# Patient Record
Sex: Male | Born: 1940 | Race: White | Hispanic: No | Marital: Married | Smoking: Never smoker
Health system: Southern US, Community
[De-identification: ages and names within clinical notes are randomized; demographics above are authoritative.]

## PROBLEM LIST (undated history)

## (undated) DIAGNOSIS — Z8669 Personal history of other diseases of the nervous system and sense organs: Secondary | ICD-10-CM

## (undated) DIAGNOSIS — E785 Hyperlipidemia, unspecified: Secondary | ICD-10-CM

## (undated) DIAGNOSIS — F419 Anxiety disorder, unspecified: Secondary | ICD-10-CM

## (undated) DIAGNOSIS — R351 Nocturia: Secondary | ICD-10-CM

## (undated) DIAGNOSIS — J309 Allergic rhinitis, unspecified: Secondary | ICD-10-CM

## (undated) DIAGNOSIS — I10 Essential (primary) hypertension: Secondary | ICD-10-CM

## (undated) DIAGNOSIS — IMO0002 Reserved for concepts with insufficient information to code with codable children: Secondary | ICD-10-CM

## (undated) DIAGNOSIS — M1990 Unspecified osteoarthritis, unspecified site: Secondary | ICD-10-CM

## (undated) DIAGNOSIS — Z8719 Personal history of other diseases of the digestive system: Secondary | ICD-10-CM

## (undated) DIAGNOSIS — I471 Supraventricular tachycardia, unspecified: Secondary | ICD-10-CM

## (undated) DIAGNOSIS — Z8673 Personal history of transient ischemic attack (TIA), and cerebral infarction without residual deficits: Secondary | ICD-10-CM

## (undated) DIAGNOSIS — N281 Cyst of kidney, acquired: Secondary | ICD-10-CM

## (undated) DIAGNOSIS — Z86010 Personal history of colonic polyps: Secondary | ICD-10-CM

## (undated) DIAGNOSIS — R252 Cramp and spasm: Secondary | ICD-10-CM

## (undated) DIAGNOSIS — C61 Malignant neoplasm of prostate: Secondary | ICD-10-CM

## (undated) DIAGNOSIS — I2510 Atherosclerotic heart disease of native coronary artery without angina pectoris: Secondary | ICD-10-CM

## (undated) DIAGNOSIS — R7303 Prediabetes: Secondary | ICD-10-CM

## (undated) DIAGNOSIS — K5730 Diverticulosis of large intestine without perforation or abscess without bleeding: Secondary | ICD-10-CM

## (undated) DIAGNOSIS — K219 Gastro-esophageal reflux disease without esophagitis: Secondary | ICD-10-CM

## (undated) DIAGNOSIS — I6930 Unspecified sequelae of cerebral infarction: Secondary | ICD-10-CM

## (undated) DIAGNOSIS — Z973 Presence of spectacles and contact lenses: Secondary | ICD-10-CM

## (undated) DIAGNOSIS — Z9289 Personal history of other medical treatment: Secondary | ICD-10-CM

## (undated) DIAGNOSIS — Z860101 Personal history of adenomatous and serrated colon polyps: Secondary | ICD-10-CM

## (undated) DIAGNOSIS — J449 Chronic obstructive pulmonary disease, unspecified: Secondary | ICD-10-CM

## (undated) HISTORY — DX: Essential (primary) hypertension: I10

## (undated) HISTORY — DX: Supraventricular tachycardia, unspecified: I47.10

## (undated) HISTORY — PX: CARDIOVASCULAR STRESS TEST: SHX262

## (undated) HISTORY — PX: TRANSTHORACIC ECHOCARDIOGRAM: SHX275

## (undated) HISTORY — PX: NASAL SINUS SURGERY: SHX719

## (undated) HISTORY — PX: COLONOSCOPY: SHX174

## (undated) HISTORY — DX: Supraventricular tachycardia: I47.1

## (undated) HISTORY — DX: Cyst of kidney, acquired: N28.1

## (undated) HISTORY — DX: Reserved for concepts with insufficient information to code with codable children: IMO0002

## (undated) HISTORY — DX: Gastro-esophageal reflux disease without esophagitis: K21.9

## (undated) HISTORY — DX: Chronic obstructive pulmonary disease, unspecified: J44.9

## (undated) HISTORY — DX: Anxiety disorder, unspecified: F41.9

## (undated) HISTORY — DX: Atherosclerotic heart disease of native coronary artery without angina pectoris: I25.10

## (undated) HISTORY — DX: Unspecified osteoarthritis, unspecified site: M19.90

## (undated) HISTORY — DX: Hyperlipidemia, unspecified: E78.5

---

## 1999-04-11 ENCOUNTER — Encounter: Admission: RE | Admit: 1999-04-11 | Discharge: 1999-04-26 | Payer: Self-pay | Admitting: Family Medicine

## 2001-05-21 ENCOUNTER — Ambulatory Visit (HOSPITAL_COMMUNITY): Admission: RE | Admit: 2001-05-21 | Discharge: 2001-05-21 | Payer: Self-pay | Admitting: *Deleted

## 2001-05-21 ENCOUNTER — Encounter: Payer: Self-pay | Admitting: *Deleted

## 2001-08-17 ENCOUNTER — Emergency Department (HOSPITAL_COMMUNITY): Admission: EM | Admit: 2001-08-17 | Discharge: 2001-08-17 | Payer: Self-pay | Admitting: Emergency Medicine

## 2001-08-31 ENCOUNTER — Ambulatory Visit (HOSPITAL_COMMUNITY): Admission: RE | Admit: 2001-08-31 | Discharge: 2001-08-31 | Payer: Self-pay | Admitting: Family Medicine

## 2001-08-31 ENCOUNTER — Encounter: Payer: Self-pay | Admitting: Family Medicine

## 2001-10-27 ENCOUNTER — Encounter: Payer: Self-pay | Admitting: Emergency Medicine

## 2001-10-27 ENCOUNTER — Emergency Department (HOSPITAL_COMMUNITY): Admission: EM | Admit: 2001-10-27 | Discharge: 2001-10-27 | Payer: Self-pay | Admitting: Emergency Medicine

## 2002-01-24 ENCOUNTER — Ambulatory Visit (HOSPITAL_COMMUNITY): Admission: RE | Admit: 2002-01-24 | Discharge: 2002-01-24 | Payer: Self-pay | Admitting: *Deleted

## 2002-01-24 ENCOUNTER — Encounter: Payer: Self-pay | Admitting: *Deleted

## 2002-02-07 ENCOUNTER — Encounter: Payer: Self-pay | Admitting: Family Medicine

## 2002-02-07 ENCOUNTER — Ambulatory Visit (HOSPITAL_COMMUNITY): Admission: RE | Admit: 2002-02-07 | Discharge: 2002-02-07 | Payer: Self-pay | Admitting: Family Medicine

## 2002-02-23 ENCOUNTER — Ambulatory Visit (HOSPITAL_COMMUNITY): Admission: RE | Admit: 2002-02-23 | Discharge: 2002-02-23 | Payer: Self-pay | Admitting: Family Medicine

## 2002-02-23 ENCOUNTER — Encounter: Payer: Self-pay | Admitting: Family Medicine

## 2002-04-01 ENCOUNTER — Encounter: Payer: Self-pay | Admitting: Internal Medicine

## 2002-04-01 ENCOUNTER — Ambulatory Visit (HOSPITAL_COMMUNITY): Admission: RE | Admit: 2002-04-01 | Discharge: 2002-04-01 | Payer: Self-pay | Admitting: Internal Medicine

## 2002-07-19 ENCOUNTER — Encounter: Payer: Self-pay | Admitting: Family Medicine

## 2002-07-19 ENCOUNTER — Ambulatory Visit (HOSPITAL_COMMUNITY): Admission: RE | Admit: 2002-07-19 | Discharge: 2002-07-19 | Payer: Self-pay | Admitting: Family Medicine

## 2004-11-16 HISTORY — PX: ESOPHAGOGASTRODUODENOSCOPY (EGD) WITH ESOPHAGEAL DILATION: SHX5812

## 2004-11-18 ENCOUNTER — Ambulatory Visit (HOSPITAL_COMMUNITY): Admission: RE | Admit: 2004-11-18 | Discharge: 2004-11-18 | Payer: Self-pay | Admitting: Family Medicine

## 2004-11-22 ENCOUNTER — Ambulatory Visit (HOSPITAL_COMMUNITY): Admission: RE | Admit: 2004-11-22 | Discharge: 2004-11-22 | Payer: Self-pay | Admitting: Family Medicine

## 2004-11-27 ENCOUNTER — Ambulatory Visit: Payer: Self-pay | Admitting: Internal Medicine

## 2004-12-10 ENCOUNTER — Ambulatory Visit: Payer: Self-pay | Admitting: Internal Medicine

## 2005-05-15 ENCOUNTER — Ambulatory Visit (HOSPITAL_COMMUNITY): Admission: RE | Admit: 2005-05-15 | Discharge: 2005-05-15 | Payer: Self-pay | Admitting: Family Medicine

## 2005-06-17 ENCOUNTER — Encounter (INDEPENDENT_AMBULATORY_CARE_PROVIDER_SITE_OTHER): Payer: Self-pay | Admitting: *Deleted

## 2005-06-17 ENCOUNTER — Ambulatory Visit (HOSPITAL_COMMUNITY): Admission: RE | Admit: 2005-06-17 | Discharge: 2005-06-17 | Payer: Self-pay | Admitting: Gastroenterology

## 2005-06-18 ENCOUNTER — Ambulatory Visit (HOSPITAL_COMMUNITY): Admission: RE | Admit: 2005-06-18 | Discharge: 2005-06-18 | Payer: Self-pay | Admitting: Family Medicine

## 2008-09-04 ENCOUNTER — Encounter: Admission: RE | Admit: 2008-09-04 | Discharge: 2008-09-04 | Payer: Self-pay | Admitting: Internal Medicine

## 2009-05-25 ENCOUNTER — Ambulatory Visit (HOSPITAL_COMMUNITY): Admission: RE | Admit: 2009-05-25 | Discharge: 2009-05-25 | Payer: Self-pay | Admitting: Family Medicine

## 2009-05-25 DIAGNOSIS — I6930 Unspecified sequelae of cerebral infarction: Secondary | ICD-10-CM

## 2009-05-25 HISTORY — DX: Unspecified sequelae of cerebral infarction: I69.30

## 2009-06-27 ENCOUNTER — Encounter: Admission: RE | Admit: 2009-06-27 | Discharge: 2009-06-27 | Payer: Self-pay | Admitting: Neurology

## 2009-07-23 ENCOUNTER — Ambulatory Visit: Payer: Self-pay

## 2009-07-23 ENCOUNTER — Encounter (INDEPENDENT_AMBULATORY_CARE_PROVIDER_SITE_OTHER): Payer: Self-pay | Admitting: Neurology

## 2009-07-23 ENCOUNTER — Ambulatory Visit (HOSPITAL_COMMUNITY): Admission: RE | Admit: 2009-07-23 | Discharge: 2009-07-23 | Payer: Self-pay | Admitting: Neurology

## 2009-07-23 ENCOUNTER — Ambulatory Visit: Payer: Self-pay | Admitting: Cardiology

## 2009-07-24 ENCOUNTER — Encounter: Payer: Self-pay | Admitting: Cardiology

## 2009-08-08 ENCOUNTER — Telehealth (INDEPENDENT_AMBULATORY_CARE_PROVIDER_SITE_OTHER): Payer: Self-pay | Admitting: *Deleted

## 2010-08-18 HISTORY — PX: APPENDECTOMY: SHX54

## 2010-08-18 HISTORY — PX: INGUINAL HERNIA REPAIR: SUR1180

## 2010-09-06 ENCOUNTER — Ambulatory Visit (HOSPITAL_COMMUNITY)
Admission: RE | Admit: 2010-09-06 | Discharge: 2010-09-06 | Payer: Self-pay | Source: Home / Self Care | Attending: Family Medicine | Admitting: Family Medicine

## 2010-09-07 ENCOUNTER — Encounter: Payer: Self-pay | Admitting: Family Medicine

## 2010-09-08 ENCOUNTER — Encounter: Payer: Self-pay | Admitting: Family Medicine

## 2010-09-09 LAB — POCT I-STAT, CHEM 8
BUN: 19 mg/dL (ref 6–23)
Calcium, Ion: 1.19 mmol/L (ref 1.12–1.32)
Chloride: 103 mEq/L (ref 96–112)
Creatinine, Ser: 1.3 mg/dL (ref 0.4–1.5)
Glucose, Bld: 96 mg/dL (ref 70–99)
HCT: 50 % (ref 39.0–52.0)
Hemoglobin: 17 g/dL (ref 13.0–17.0)
Potassium: 4.7 mEq/L (ref 3.5–5.1)
Sodium: 140 meq/L (ref 135–145)
TCO2: 29 mmol/L (ref 0–100)

## 2010-10-20 ENCOUNTER — Emergency Department (HOSPITAL_COMMUNITY): Payer: Medicare Other

## 2010-10-20 ENCOUNTER — Inpatient Hospital Stay (HOSPITAL_COMMUNITY)
Admission: EM | Admit: 2010-10-20 | Discharge: 2010-10-22 | DRG: 313 | Disposition: A | Discharge status: Home / Self Care | Payer: Medicare Other | Attending: Internal Medicine | Admitting: Internal Medicine

## 2010-10-20 DIAGNOSIS — J449 Chronic obstructive pulmonary disease, unspecified: Secondary | ICD-10-CM | POA: Diagnosis present

## 2010-10-20 DIAGNOSIS — K219 Gastro-esophageal reflux disease without esophagitis: Secondary | ICD-10-CM | POA: Diagnosis present

## 2010-10-20 DIAGNOSIS — R079 Chest pain, unspecified: Principal | ICD-10-CM | POA: Diagnosis present

## 2010-10-20 DIAGNOSIS — I10 Essential (primary) hypertension: Secondary | ICD-10-CM | POA: Diagnosis present

## 2010-10-20 DIAGNOSIS — G4733 Obstructive sleep apnea (adult) (pediatric): Secondary | ICD-10-CM | POA: Diagnosis present

## 2010-10-20 DIAGNOSIS — E785 Hyperlipidemia, unspecified: Secondary | ICD-10-CM | POA: Diagnosis present

## 2010-10-20 DIAGNOSIS — I209 Angina pectoris, unspecified: Secondary | ICD-10-CM | POA: Diagnosis present

## 2010-10-20 DIAGNOSIS — K224 Dyskinesia of esophagus: Secondary | ICD-10-CM | POA: Diagnosis present

## 2010-10-20 DIAGNOSIS — E781 Pure hyperglyceridemia: Secondary | ICD-10-CM | POA: Diagnosis present

## 2010-10-20 DIAGNOSIS — J4489 Other specified chronic obstructive pulmonary disease: Secondary | ICD-10-CM | POA: Diagnosis present

## 2010-10-20 LAB — CBC
HCT: 45 % (ref 39.0–52.0)
Hemoglobin: 15.3 g/dL (ref 13.0–17.0)
MCH: 29.9 pg (ref 26.0–34.0)
MCHC: 34 g/dL (ref 30.0–36.0)
MCV: 87.9 fL (ref 78.0–100.0)
Platelets: 136 10*3/uL — ABNORMAL LOW (ref 150–400)
RDW: 12.7 % (ref 11.5–15.5)
WBC: 5.9 10*3/uL (ref 4.0–10.5)

## 2010-10-20 LAB — POCT I-STAT, CHEM 8
BUN: 16 mg/dL (ref 6–23)
Creatinine, Ser: 0.9 mg/dL (ref 0.4–1.5)
Glucose, Bld: 109 mg/dL — ABNORMAL HIGH (ref 70–99)
Hemoglobin: 15.6 g/dL (ref 13.0–17.0)
Potassium: 4 mEq/L (ref 3.5–5.1)

## 2010-10-20 LAB — POCT CARDIAC MARKERS: Troponin i, poc: <0.05 ng/mL (ref 0.00–0.09)

## 2010-10-20 LAB — CARDIAC PANEL(CRET KIN+CKTOT+MB+TROPI): Total CK: 250 U/L — ABNORMAL HIGH (ref 7–232)

## 2010-10-21 LAB — CARDIAC PANEL(CRET KIN+CKTOT+MB+TROPI)
CK, MB: 4.9 ng/mL — ABNORMAL HIGH (ref 0.3–4.0)
CK, MB: 4.7 ng/mL — ABNORMAL HIGH (ref 0.3–4.0)
Relative Index: 2.6 — ABNORMAL HIGH (ref 0.0–2.5)
Total CK: 191 U/L (ref 7–232)
Total CK: 172 U/L (ref 7–232)
Troponin I: 0.01 ng/mL (ref 0.00–0.06)
Troponin I: 0.01 ng/mL (ref 0.00–0.06)

## 2010-10-21 LAB — COMPREHENSIVE METABOLIC PANEL
AST: 27 U/L (ref 0–37)
BUN: 13 mg/dL (ref 6–23)
Calcium: 8.6 mg/dL (ref 8.4–10.5)
Chloride: 106 mEq/L (ref 96–112)
Creatinine, Ser: 0.87 mg/dL (ref 0.4–1.5)
GFR calc Af Amer: >60 mL/min (ref >60)
GFR calc non Af Amer: >60 mL/min (ref >60)
Glucose, Bld: 113 mg/dL — ABNORMAL HIGH (ref 70–99)
Sodium: 139 mEq/L (ref 135–145)
Total Bilirubin: 0.7 mg/dL (ref 0.3–1.2)

## 2010-10-21 LAB — CBC
MCH: 29.6 pg (ref 26.0–34.0)
MCV: 88.9 fL (ref 78.0–100.0)
RBC: 5.13 MIL/uL (ref 4.22–5.81)
WBC: 5.2 10*3/uL (ref 4.0–10.5)

## 2010-10-21 LAB — LIPID PANEL
Total CHOL/HDL Ratio: 4.9 RATIO
VLDL: 58 mg/dL — ABNORMAL HIGH (ref 0–40)

## 2010-10-21 LAB — HEMOGLOBIN A1C: Mean Plasma Glucose: 123 mg/dL — ABNORMAL HIGH (ref <117)

## 2010-10-21 LAB — MAGNESIUM: Magnesium: 2.4 mg/dL (ref 1.5–2.5)

## 2010-10-24 NOTE — H&P (Signed)
NAME:  GUHAN, BRUINGTON NO.:  [id number]  MEDICAL RECORD NO.:  [id number]           PATIENT TYPE:  E  LOCATION:  WLED                         FACILITY:  [facility]  PHYSICIAN:  Derik Fults I Quentin Shorey, MD      DATE OF BIRTH:  05/31/1941  DATE OF ADMISSION:  10/20/2010 DATE OF DISCHARGE:                             HISTORY & PHYSICAL   PRIMARY CARE PHYSICIAN:  Kerri Perches, MD, from [place].  CHIEF COMPLAINT:  Chest pain.  HISTORY OF PRESENT ILLNESS:  This is a 70 year old Caucasian, very present gentleman with history of hypercholesterolemia, hypertension, asthma/COPD, who was in his regular state of health until today while in the church.  They were standing and he felt sudden onset of left side chest pain, started abnormal feeling on his neck and throat, radiated into his chest or vice versa, it started on his chest and radiated to his neck and jaw.  Pain was severe, 8/10 and not associated with sweating or palpitation.  Pain kept on and off.  There is no relieving factor, but on his way to the hospital, he is admitted, the pain is resolved.  While I am doing some conversation with the patient, he developed another few seconds chest pain, no palpitation, no shortness of breath.  The patient admitted he has similar episode of this pain last week, happened twice, and they planned to follow up with his primary care regarding this pain.  As mentioned, the patient denies any cough.  He had episode of pleuritic chest pain when laughing, happened few times, and per his MD this is secondary to pleurisy.  The patient denies any numbness or weakness of his extremity.  Denies any blurring of vision.  Denies any neck pain.  The patient admitted, pain currently resolved.  Denies any cough.  Denies any fever.  Denies any palpitation. Denies any shortness of breath.  Denies any orthopnea or paroxysmal nocturnal dyspnea.  Denies any nausea, vomiting, or abdominal pain. Last bowel movement  was yesterday.  Denies any back pain.  Denies any abnormal rashes.  PAST MEDICAL HISTORY:  Significant for; 1. Hypertension. 2. Hypercholesterolemia. 3. Gastroesophageal reflux disease. 4. History of benign prostatic hypertrophy. 5. History of GI bleeding, status post colonoscopy, which is small     ascending colon polyp. 6. History of stroke, the patient on Plavix for that.  ALLERGIES: 1. KEFLEX. 2. SULFA.  PAST SURGICAL HISTORY:  History of sinus surgery and history of hernia repair.  FAMILY HISTORY:  Mother died with hemorrhagic stroke, father died with complication of Parkinson.  He has a sister, who died from breast cancer with metastasis and a brother who died with automobile accident.  MEDICATIONS: 1. __________. 2. Guaifenesin. 3. Atenolol 50 mg. 4. Etodolac 400. 5. Simvastatin 20 mg. 6. Advair Diskus 150. 7. Protonix 40 mg. 8. Plavix 75 mg. 9. Niacin. 10.Multivitamin. 11.Aw palmetto 12.Fish oil.  SYSTEMIC REVIEW:  As per HPI.  PHYSICAL EXAMINATION:  VITAL SIGNS:  On admission, temperature 98, bloodpressure 158/83, pulse rate 66, respiratory rate 18, saturating 96% on room air. HEENT:  Normocephalic, atraumatic.  Pupil equal, reactive  to light and accommodation.  An  extraocular muscle movement within normal.  Nares patent.  No discharge.  Mucosa moist.  No tonsillar hypertrophy.  No thrush. NECK:  Supple.  No lymphadenopathy. HEART:  S1 and S2 with no added sound, mildly distant. LUNGS:  Normal vesicular breathing with equal air entry.  No rales, no crackles. ABDOMEN:  Soft, nontender.  Bowel sounds positive.  No organomegaly.  No rebound or guarding. EXTREMITIES:  No lower edema.  Peripheral pulses intact bilaterally. BACK:  No point tenderness.  LABORATORY DATA:  On exam, blood work did show cardiac marker x1 negative.  Hemoglobin 15.6, hematocrit 46.  Sodium 140, potassium 4, chloride 108, glucose 109, BUN 16, creatinine 0.9.  Chest x-ray,  no active disease.  EKG did show normal sinus rhythm, left axis deviation, minimum voltage criteria for LVH, may be normal variant, abnormal EKG.  ASSESSMENT AND PLAN:  This is a 70 year old male with a history of hypertension, hypercholesterolemia, history of stroke in the past. 1. He complains of recurrent chest pain this week.  We will admit the     patient for evaluation and cardiac risk of stratification.  The     patient will be admitted to Telemetry.  We will cycle cardiac     enzyme, will get D-dimer, will get 2D-echo.  Repeat EKG.  We will     provide the patient with nitroglycerin sublingual.  The patient     used atenolol.  We will continue with atenolol p.o.  The patient     did see Dr. __________ in the past for evaluation of abnormal     rhythm.  We will consider consulting them in a.m. if pain persists. 2. Hypertension.  We will continue with home medication. 3. History of chronic obstructive pulmonary disease/asthma.  We will     continue with nebs treatment.  Further recommendation to be     addressed as hospital course progresses.     Salma Walrond Bosie Helper, MD     HIE/MEDQ  D:  10/20/2010  T:  10/20/2010  Job:  [id number]  cc:   Kerri Perches, MD  Electronically Signed by Ebony Cargo MD on 10/23/2010 05:13:37 PM

## 2010-10-25 NOTE — Consult Note (Signed)
NAME:  Gregory Pham, Gregory Pham NO.:  [id number]  MEDICAL RECORD NO.:  [id number]           PATIENT TYPE:  LOCATION:                                 FACILITY:  PHYSICIAN:  Armanda Magic, M.D.     DATE OF BIRTH:  1941/07/26  DATE OF CONSULTATION:  10/21/2010 DATE OF DISCHARGE:                                CONSULTATION   REFERRING PHYSICIAN:  [facility].  PRIMARY PHYSICIAN:  Fayrene Fearing, physician assistant.  CHIEF COMPLAINT:  Chest pain.  HISTORY OF PRESENT ILLNESS:  This is a very pleasant 70 year old male with a history of dyslipidemia, hypertension, and asthma/COPD who was in his usual state of health until yesterday when he was in church.  He said he stood up and felt sudden onset of left-sided chest pain and started feeling a funny sensation in his neck and throat, which radiated up into his chest.  It was severe at a 10 but not associated with sweating or palpitations.  The pain was intermittent and he decided to leave church.  He denied any diaphoresis but said he was belching a lot when he got to the emergency room.  On arrival in the ER, he was painfree.  Apparently, he had a similar episode of pain a week ago but he said it was more pleuritic and hurt when he laughed.  We are now asked to consult for further evaluation.  PAST MEDICAL HISTORY:  Hypertension, dyslipidemia, gastroesophageal reflux disease, history of BPH, history of GI bleed, status post colonoscopy with small ascending colon polyps, history of CVA currently on Plavix.  ALLERGIES:  Multiple include KEFLEX and SULFA.  PAST SURGICAL HISTORY:  History of sinus surgery, hernia repair, and appendectomy.  FAMILY HISTORY:  His mother died of a hemorrhagic CVA.  His father died with complications of Parkinson.  He has a sister who died from breast CA with mets and a brother who died in an automobile accident.  MEDICATION:  Guaifenesin, atenolol 50 mg daily, etodolac 400  mg, simvastatin 20 mg daily, Advair Diskus 150, Protonix 40 mg daily, Plavix 75 mg daily, niacin, multivitamin, saw palmetto, and fish oil.  REVIEW OF SYSTEMS:  Other than what is stated in HPI is negative.  PHYSICAL EXAMINATION:  VITAL SIGNS:  His blood pressure is 118/64, heart rate 92, respirations 20s, he is afebrile, O2 saturation 98% on room air. GENERAL:  He is a well-developed, well-nourished white male in no acute distress. HEENT:  Benign. NECK:  Supple without lymphadenopathy.  Carotid upstrokes are +2 bilaterally.  No bruits. LUNGS:  Clear to auscultation throughout. HEART:  Regular rate and rhythm.  No murmurs, rubs, or gallops.  Normal S1 and S2. ABDOMEN:  Soft, nontender, and nondistended.  Normoactive bowel sounds. No hepatosplenomegaly. EXTREMITIES:  No edema.  LABORATORY DATA:  Sodium 140, potassium 4, chloride 108, CO2 22, BUN 16, creatinine 0.9, glucose 109.  Point of care markers negative x1, D-dimer 0.39.  Cardiac enzymes, CPK 250, 191, 172 with MB 6.6, 4.9, and 4.7. Troponins were 0.01, 0.01, and 0.01.  BNP less than 30.  White blood cell count  5.2, hemoglobin 15.2, hematocrit 45.6, platelet count 130. Magnesium 2.4, phosphorus 3.5, total cholesterol 153, triglycerides 288, HDL 31, LDL 64, hemoglobin A1c 5.9%.  Chest x-ray showed no active disease.  EKG shows sinus rhythm at 74 beats per minute with left ventricular hypertrophy by voltage criteria with no ST-T wave abnormalities.  ASSESSMENT: 1. Chest pain with positive CPK-MB but normal CPK, troponin.  The MB     is barely elevated.  EKG is nonischemic. 2. Hypertension. 3. Chronic obstructive sleep apnea and asthma. 4. Dyslipidemia.  PLAN:  The patient has not had any further chest pain after being admitted.  His pain could be due to either underlying CAD, but also could be due to gastroesophageal reflux with esophageal spasm. Especially given the fact that it was associated with belching which seemed  after belching to resolve his pain.  Given his nonischemic EKG and normal troponin levels, we will proceed with nuclear stress test.  I will make him n.p.o. after midnight and plan nuclear stress Cardiolite study in the morning on October 22, 2010.     Armanda Magic, M.D.     TT/MEDQ  D:  10/21/2010  T:  10/22/2010  Job:  [id number]  cc:   Fayrene Fearing  Electronically Signed by Armanda Magic M.D. on 10/24/2010 04:20:05 PM

## 2010-11-23 NOTE — Discharge Summary (Signed)
  NAME:  Gregory Pham, Gregory Pham NO.:  [id number]  MEDICAL RECORD NO.:  [id number]           PATIENT TYPE:  I  LOCATION:  1419                         FACILITY:  [facility]  PHYSICIAN:  Richarda Overlie, MD       DATE OF BIRTH:  Oct 08, 1940  DATE OF ADMISSION:  10/20/2010 DATE OF DISCHARGE:  10/22/2010                        DISCHARGE SUMMARY - REFERRING   PRIMARY CARE PHYSICIAN:  Dr. Gaetano Hawthorne.  Dictation ended at this point.     Richarda Overlie, MD     NA/MEDQ  D:  10/22/2010  T:  10/22/2010  Job:  [id number]  Electronically Signed by Richarda Overlie MD on 11/23/2010 08:34:12 PM

## 2010-11-23 NOTE — Discharge Summary (Signed)
NAME:  Gregory Pham, Gregory Pham NO.:  [id number]  MEDICAL RECORD NO.:  [id number]           PATIENT TYPE:  I  LOCATION:  1419                         FACILITY:  [facility]  PHYSICIAN:  Richarda Overlie, MD       DATE OF BIRTH:  03/02/1941  DATE OF ADMISSION:  10/20/2010 DATE OF DISCHARGE:  10/22/2010                        DISCHARGE SUMMARY - REFERRING   PRIMARY CARE PHYSICIAN:  Dr. Burney Gauze  DISCHARGE DIAGNOSIS: 1. Chest pain.  Stress test pending at this time. 2. Hypertension. 3. Chronic obstructive pulmonary disease. 4. Sleep apnea. 5. Asthma. 6. Dyslipidemia. 7. Hypertriglyceridemia.  CONSULTATION:  Dr. Carolanne Grumbling for chest pain.  PROCEDURES:  Chest x-ray shows no active disease.  SUBJECTIVE:  This 70 year old Caucasian male presents with a history of dyslipidemia, hypertension, asthma, COPD, who was in his regular state of health until today when he had sudden onset of dizziness and left- sided chest pain and started having abnormal feeling on his neck and throat radiated into the chest and vice versa.  The pain started in his chest and radiated into the neck and jaw, described 8/10 on  intensity without any relieving or aggravating factors.  The patient subsequently admitted for further evaluation.  HOSPITAL COURSE: 1. Chest pain.  The patient's cardiac enzymes were cycled and troponin     was negative but a CK-MB was mildly elevated with an increased     relative index.  The patient was seen by Cardiology, Dr. Carolanne Grumbling who wanted to do an inpatient stress test, however, the     inpatient stress test was scheduled for later in the day, however,     the patient became anxious and wanted to go home and get an     outpatient stress test.  An outpatient stress test was arranged for     by Dr. Carolanne Grumbling and Dr. Donato Schultz on October 23, 2010, at around     10:00 a.m. in their office.  Cardiology also stated that the     patient's chest discomfort could  be typical angina or it could also     be gastroesophageal reflux disease with esophageal spasm.  The     patient's EKG appears to be nonischemic and his troponin were     within normal limits. 2. Hypertension.  The patient was found to be normotensive during this     hospitalization, however, he was advised to hold his atenolol prior     to the stress test.  DISCHARGE MEDICATIONS: 1. Nitroglycerin sublingual 0.4 q. 5 minutes x3. 2. Melatonin 1 tablet p.o. q.h.s. 3. Atenolol 50 mg p.o. q.a.m.  The patient advised to hold this. 4. Etodolac has been discontinued. 5. Simvastatin 20 mg p.o. q.a.m. 6. Advair Diskus 100/50 one puff inhaled twice a day. 7. Flonase 2 puffs q.p.m. 8. Protonix 40 mg p.o. q.p.m. 9. Plavix 75 mg p.o. q.a.m. 10.Niacin 500 mg p.o. t.i.d. 11.Multivitamin 1 tablet p.o. daily. 12.Cranberry 2 tablets p.o. 3 times a day. 13.Saw palmetto 2 tablets p.o. 3 times a day. 14.Ester-C 1000 mg p.o. q.a.m. 15.Fish oil 1000 one  to two tablets p.o. t.i.d. 16.Glucosamine 3 tablets p.o. daily. 17.Hydroxyzine 0.5 mg p.o. q.h.s.18.Guaifenesin 400 mg 2 tablets p.o. twice a day.     Richarda Overlie, MD     NA/MEDQ  D:  10/22/2010  T:  10/22/2010  Job:  [id number]  cc:   Dr. Burney Gauze  Electronically Signed by Richarda Overlie MD on 11/23/2010 08:34:33 PM

## 2011-01-03 NOTE — Op Note (Signed)
NAME:  Gregory Pham, Gregory Pham NO.:  [id number]   MEDICAL RECORD NO.:  [id number]          PATIENT TYPE:  AMB   LOCATION:  ENDO                         FACILITY:  [facility]   PHYSICIAN:  Graylin Shiver, M.D.   DATE OF BIRTH:  13-Mar-1941   DATE OF PROCEDURE:  06/17/2005  DATE OF DISCHARGE:                                 OPERATIVE REPORT   INDICATIONS FOR PROCEDURE:  Heme-positive stool, family history of colon  cancer in the patient's sister.   Informed consent was obtained after explanation of the risks of bleeding,  infection and perforation.   PREMEDICATION:  Fentanyl 50 mcg IV, Versed 5 milligrams IV.   PROCEDURE:  With the patient in the left lateral decubitus position a rectal  exam was performed. No masses were felt. The Olympus colonoscope was  inserted into the rectum and advanced around the colon to the cecum. Cecal  landmarks were identified. The cecum was normal.  In the mid descending  colon, there was a 2-mm polyp biopsied off with cold forceps. The transverse  colon looked normal. The descending colon, sigmoid and rectum looked normal.  The scope was retroflexed in the rectum. No abnormalities were seen. He  tolerated the procedure well without complications.   IMPRESSION:  Small ascending colon polyp biopsied off.   PLAN:  The pathology will be checked.           ______________________________  Graylin Shiver, M.D.     SFG/MEDQ  D:  06/17/2005  T:  06/17/2005  Job:  [id number]   cc:   Holley Bouche, M.D.  Fax: [phone]

## 2011-08-19 DIAGNOSIS — Z8673 Personal history of transient ischemic attack (TIA), and cerebral infarction without residual deficits: Secondary | ICD-10-CM

## 2011-08-19 HISTORY — DX: Personal history of transient ischemic attack (TIA), and cerebral infarction without residual deficits: Z86.73

## 2011-10-21 ENCOUNTER — Encounter: Payer: Self-pay | Admitting: Internal Medicine

## 2011-11-11 ENCOUNTER — Ambulatory Visit (INDEPENDENT_AMBULATORY_CARE_PROVIDER_SITE_OTHER): Payer: Medicare Other | Admitting: Internal Medicine

## 2011-11-11 ENCOUNTER — Encounter: Payer: Self-pay | Admitting: Internal Medicine

## 2011-11-11 VITALS — BP 124/68 | HR 60 | Ht 71.0 in | Wt 181.0 lb

## 2011-11-11 DIAGNOSIS — Z86010 Personal history of colonic polyps: Secondary | ICD-10-CM

## 2011-11-11 DIAGNOSIS — Z7902 Long term (current) use of antithrombotics/antiplatelets: Secondary | ICD-10-CM

## 2011-11-11 DIAGNOSIS — Z01818 Encounter for other preprocedural examination: Secondary | ICD-10-CM

## 2011-11-11 DIAGNOSIS — I679 Cerebrovascular disease, unspecified: Secondary | ICD-10-CM

## 2011-11-11 DIAGNOSIS — Z1211 Encounter for screening for malignant neoplasm of colon: Secondary | ICD-10-CM

## 2011-11-11 MED ORDER — PEG-KCL-NACL-NASULF-NA ASC-C 100 G PO SOLR: 1.0000 | Freq: Once | ORAL | Status: DC

## 2011-11-11 NOTE — Patient Instructions (Addendum)
You have been scheduled for a colonoscopy. Please follow written instructions given to you at your visit today.  Please pick up your prep kit at the pharmacy within the next 1-3 days. You will be contacted by our office prior to your procedure for directions on holding your Plavix.  If you do not hear from our office 1 week prior to your scheduled procedure, please call [phone] to discuss.

## 2011-11-11 NOTE — Progress Notes (Signed)
Subjective:    Patient ID: Gregory Pham, male    DOB: [birth year], 71 y.o.   MRN: [id number]  HPI This is a very pleasant elderly white man that I know from prior colonoscopy (2003) and upper endoscopy + esophageal dilation (2006)procedures. He returns at this time to discuss and schedule a colonoscopy for colon cancer screening and polyp followup. I had performed a colonoscopy in 2003 with diverticulosis and hemorrhoids found. In 2006, Dr. Evette Cristal found a diminutive adenomatous polyp in the descending colon. The patient requested to return here for routine repeat colonoscopy.  He thinks his sister had colon cancer in her 45s, and then ultimately developed breast cancer and died from that.  GI review of systems is otherwise negative.  He takes Plavix because of a history of 2 small strokes.  Allergies  Allergen Reactions  . Azithromycin   . Biaxin   . Cephalexin   . Clindamycin/Lincomycin   . Darvocet (Propoxyphene N-Acetaminophen)   . Flexeril (Cyclobenzaprine Hcl)   . Flomax (Tamsulosin Hcl)   . Hydrocodone   . Hyoscyamine   . Levaquin   . Naprosyn (Naproxen)   . Skelaxin   . Sulfa Antibiotics   . Tussionex Pennkinetic Er    Outpatient Prescriptions Prior to Visit  Medication Sig Dispense Refill  . atenolol (TENORMIN) 50 MG tablet Take 50 mg by mouth daily.      Marland Kitchen Bioflavonoid Products (ESTER-C) 500-50 MG TABS Take 2 tablets by mouth daily.      . clopidogrel (PLAVIX) 75 MG tablet Take 75 mg by mouth daily.      . Cranberry 500 MG CAPS Take 2 capsules by mouth as directed.       . diclofenac sodium (VOLTAREN) 1 % GEL Apply 1 application topically 4 (four) times daily.      . fluticasone (FLONASE) 50 MCG/ACT nasal spray Place 1 spray into the nose daily.      . Glucosamine-Chondroit-Vit C-Mn (GLUCOSAMINE 1500 COMPLEX PO) Take 3 capsules by mouth daily with lunch.      . hydrOXYzine (ATARAX/VISTARIL) 10 MG tablet Take 1-2 tablets by mouth every night as needed for insomnia.        . Magnesium 100 MG TABS Take 2 tablets by mouth 4 (four) times daily.       . Multiple Vitamin (MULTIVITAMIN) tablet Take 1 tablet by mouth daily.      . niacin 500 MG tablet Take 500 mg by mouth 3 (three) times daily with meals.      . nitroGLYCERIN (NITROSTAT) 0.4 MG SL tablet Place 0.4 mg under the tongue every 5 (five) minutes as needed.      Marland Kitchen omega-3 fish oil (MAXEPA) 1000 MG CAPS capsule Take 1 capsule by mouth 3 (three) times daily.      . RABEprazole (ACIPHEX) 20 MG tablet Take 20 mg by mouth daily.      . Saw Palmetto 450 MG CAPS Take 2 capsules by mouth 3 (three) times daily after meals.      . simvastatin (ZOCOR) 40 MG tablet Take 20 mg by mouth every evening.       Marland Kitchen albuterol (PROVENTIL HFA) 108 (90 BASE) MCG/ACT inhaler Inhale 2 puffs into the lungs every 4 (four) hours as needed.      . Fluticasone-Salmeterol (ADVAIR) 100-50 MCG/DOSE AEPB Inhale 1 puff into the lungs 2 (two) times daily.      . metaxalone (SKELAXIN) 800 MG tablet Take 800 mg by mouth at bedtime.  Past Medical History  Diagnosis Date  . Adenomatous colon polyp   . Diverticulosis   . Internal hemorrhoids   . Esophageal stricture   . Hypertension   . Hyperlipidemia   . GERD (gastroesophageal reflux disease)   . Gallstones   . PSVT (paroxysmal supraventricular tachycardia)   . COPD (chronic obstructive pulmonary disease)   . Cataract   . Bell's palsy   . CVA (cerebral infarction)   . Plantar fasciitis   . BPH (benign prostatic hypertrophy)   . Renal cyst, right   . Anxiety   . CVD (cardiovascular disease)   . Vitamin d deficiency   . Asthma   . Osteoarthritis   . External hemorrhoids   . HH (hiatus hernia)   . Sleep apnea    Past Surgical History  Procedure Date  . Appendectomy   . Nasal sinus surgery   . Inguinal hernia repair     bilateral  . Colonoscopy   . Upper gastrointestinal endoscopy    History   Social History  . Marital Status: Married    Spouse Name: N/A    Number of  Children: 1   Occupational History  . Retired     Social History Main Topics  . Smoking status: Never Smoker   . Smokeless tobacco: Never Used  . Alcohol Use: No  . Drug Use: No  .            Social History Narrative   Daily caffeine    Family History  Problem Relation Age of Onset  . Colon cancer Sister   . Diabetes Brother     x 2  . Prostate cancer Brother     x 2  . Breast cancer Sister   . Pancreatic cancer Sister   . Aneurysm Mother   . Heart disease Mother   . Heart disease Father   . Diabetes Mother   . Breast cancer Mother   . Diabetes Father   . Parkinsonism Father         Review of Systems This is positive for back pain, cough, muscle cramps, and insomnia. All other review of systems are negative or as per history of present illness.    Objective:   Physical Exam General:  NAD Eyes:   anicteric Lungs:  clear Heart:  S1S2 no rubs, murmurs or gallops Abdomen:  soft and nontender, BS+ Ext:   no edema    Data Reviewed:  Previous endoscopy, colonoscopy reports pathology reports. Labs (scanned in)        Assessment & Plan:   1. Personal history of colonic polyps - diminutive adenoma 2006  2. Special screening for malignant neoplasms, colon   3. Long term current use of antithrombotics/antiplatelets - Plavix  4. Cerebrovascular disease - prior stroke(s)  5. Preoperative examination, unspecified    It is reasonable appropriate for him to have a routine repeat colonoscopy at this time. It has been greater than 6 years since his last colonoscopy, and an adenomatous polyp was found. He will need to hold his Plavix, we will discuss this with his primary care physician and hold that if acceptable. He does seem to be at low risk of a problem related to holding that.The risks and benefits as well as alternatives of endoscopic procedure(s) have been discussed and reviewed. All questions answered. The patient agrees to proceed.   CC: Elvera Lennox,  PA-C

## 2011-11-13 ENCOUNTER — Telehealth: Payer: Self-pay | Admitting: *Deleted

## 2011-11-13 NOTE — Telephone Encounter (Signed)
Per Dr Tiburcio Pea' note faxed back to our office on 11/13/11, patient may hold Plavix 33-5 days prior to his test as necessary. I have left a message with patient's wife for him to call back.

## 2011-11-13 NOTE — Telephone Encounter (Signed)
I have spoken to Mr. Gregory Pham and advised him that per Dr Tiburcio Pea, he may hold Plavix 5 days prior to his test. Patient verabalizes understanding.

## 2011-12-12 ENCOUNTER — Ambulatory Visit (AMBULATORY_SURGERY_CENTER): Payer: Medicare Other | Admitting: Internal Medicine

## 2011-12-12 ENCOUNTER — Encounter: Payer: Self-pay | Admitting: Internal Medicine

## 2011-12-12 VITALS — BP 130/78 | HR 77 | Temp 97.6°F | Resp 18 | Ht 71.0 in | Wt 181.0 lb

## 2011-12-12 DIAGNOSIS — D126 Benign neoplasm of colon, unspecified: Secondary | ICD-10-CM

## 2011-12-12 DIAGNOSIS — Z1211 Encounter for screening for malignant neoplasm of colon: Secondary | ICD-10-CM

## 2011-12-12 DIAGNOSIS — K5730 Diverticulosis of large intestine without perforation or abscess without bleeding: Secondary | ICD-10-CM

## 2011-12-12 MED ORDER — SODIUM CHLORIDE 0.9 % IV SOLN: 500.0000 mL | INTRAVENOUS | Status: DC

## 2011-12-12 NOTE — Progress Notes (Signed)
Patient did not have preoperative order for IV antibiotic SSI prophylaxis. (G8918)  Patient did not experience any of the following events: a burn prior to discharge; a fall within the facility; wrong site/side/patient/procedure/implant event; or a hospital transfer or hospital admission upon discharge from the facility. (G8907)  

## 2011-12-12 NOTE — Patient Instructions (Addendum)

## 2011-12-12 NOTE — Op Note (Signed)
 [facility] [address] [place], [place]  [address]  COLONOSCOPY PROCEDURE REPORT  PATIENT:  Gregory Pham, Gregory Pham  MR#:  [id number] BIRTHDATE:  08/02/1941, 70 yrs. old  GENDER:  male ENDOSCOPIST:  Iva Boop, MD, [facility]  PROCEDURE DATE:  12/12/2011 PROCEDURE:  Colonoscopy with snare polypectomy ASA CLASS:  Class II INDICATIONS:  surveillance and high-risk screening, history of pre-cancerous (adenomatous) colon polyps diminutive adenoma removed 6 years ago MEDICATIONS:   These medications were titrated to patient response per physician's verbal order, Versed 5 mg IV, Fentanyl 25 mcg IV  DESCRIPTION OF PROCEDURE:   After the risks benefits and alternatives of the procedure were thoroughly explained, informed consent was obtained.  Digital rectal exam was performed and revealed no abnormalities and normal prostate.   The LB [id number] endoscope was introduced through the anus and advanced to the cecum, which was identified by both the appendix and ileocecal valve, without limitations.  The quality of the prep was excellent, using MoviPrep.  The instrument was then slowly withdrawn as the colon was fully examined. <<PROCEDUREIMAGES>>  FINDINGS:  A diminutive polyp was found in sigmoid colon.Polyp was snared without cautery. Retrieval was successful. Moderate diverticulosis was found in the sigmoid colon.  This was otherwise a normal examination of the colon.   Retroflexed views in the rectum revealed internal hemorrhoids.    The time to cecum = 2:15 minutes. The scope was then withdrawn in 10:37 minutes from the cecum and the procedure completed. COMPLICATIONS:  None ENDOSCOPIC IMPRESSION: 1) Diminutive polyp removed 2) Moderate diverticulosis in the sigmoid colon 3) Internal hemorrhoids 4) Otherwise normal examination 5) Personal history of adenoma  REPEAT EXAM:  In for Colonoscopy, pending biopsy results.  Iva Boop, MD, [facility]  CC:  The Patient Elvera Lennox, PA-C  n. eSIGNED:   Iva Boop at 12/12/2011 02:48 PM  Ebbie Ridge, [phone]

## 2011-12-15 ENCOUNTER — Telehealth: Payer: Self-pay | Admitting: *Deleted

## 2011-12-15 NOTE — Telephone Encounter (Signed)
  Follow up Call-  Call back number 12/12/2011  Post procedure Call Back phone  # [phone] If not at home may speak with wife Corrie Dandy  Permission to leave phone message Yes     Patient questions:  Do you have a fever, pain , or abdominal swelling? no Pain Score  0 *  Have you tolerated food without any problems? yes  Have you been able to return to your normal activities? yes  Do you have any questions about your discharge instructions: Diet   no Medications  no Follow up visit  no  Do you have questions or concerns about your Care? no  Actions: * If pain score is 4 or above: No action needed, pain <4.  Spoke with wife Corrie Dandy as directed.

## 2011-12-19 ENCOUNTER — Encounter: Payer: Self-pay | Admitting: Internal Medicine

## 2011-12-19 DIAGNOSIS — Z860100 Personal history of colon polyps, unspecified: Secondary | ICD-10-CM | POA: Insufficient documentation

## 2011-12-19 NOTE — Progress Notes (Signed)
Quick Note:  Diminutive tubular adenoma Repeat colonoscopy about 12/2016 ______

## 2011-12-29 IMAGING — CR DG CHEST 1V PORT
1 series · 1 of 1 positions shown · non-contrast
Comparison: 09/04/2008

CLINICAL DATA: Chest pain

PORTABLE CHEST - 1 VIEW

[AP]
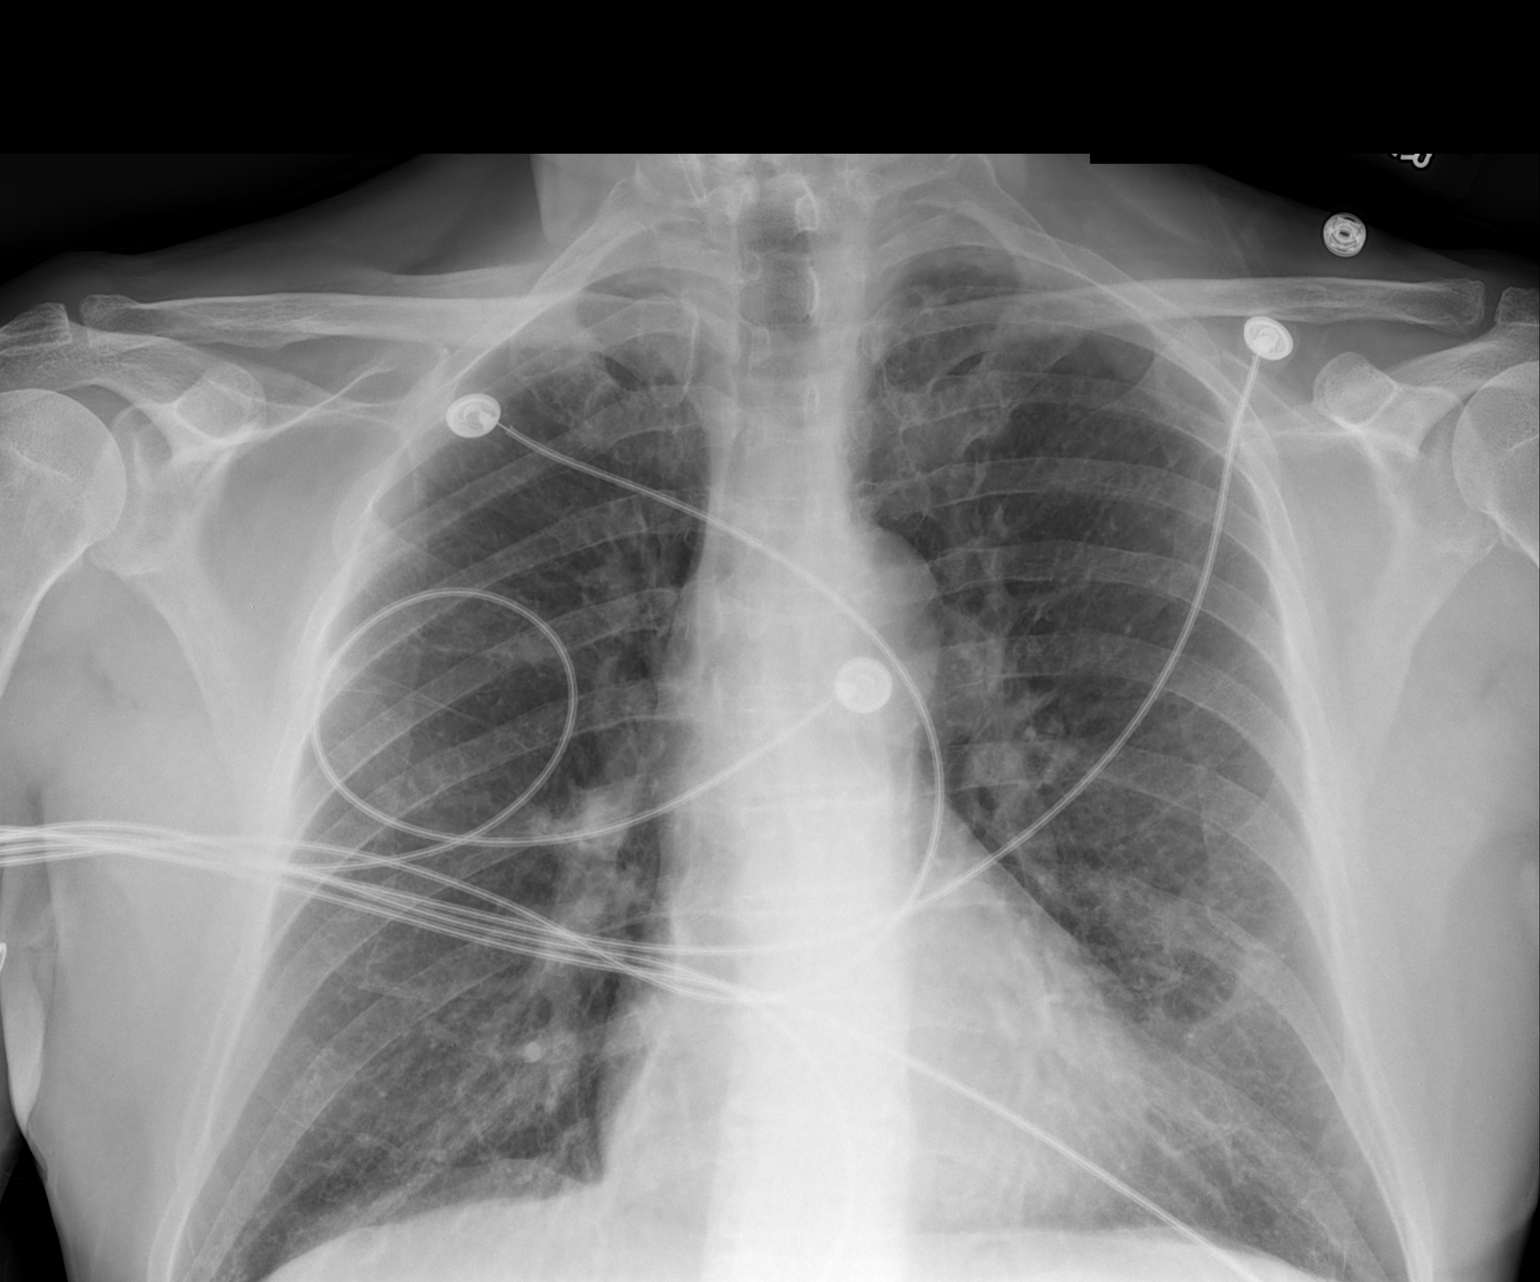

[1 of 1 positions shown; findings below may reference images not displayed]

FINDINGS: Cardiomediastinal silhouette is stable.  No acute
infiltrate or edema.  Bilateral costophrenic angles are not
included on the film.
IMPRESSION: No active disease.

## 2012-02-16 ENCOUNTER — Encounter: Payer: Self-pay | Admitting: Internal Medicine

## 2012-05-05 ENCOUNTER — Ambulatory Visit
Admission: RE | Admit: 2012-05-05 | Discharge: 2012-05-05 | Disposition: A | Discharge status: Home / Self Care | Payer: Medicare Other | Source: Ambulatory Visit | Attending: Family Medicine | Admitting: Family Medicine

## 2012-05-05 ENCOUNTER — Other Ambulatory Visit: Payer: Self-pay | Admitting: Family Medicine

## 2012-05-05 DIAGNOSIS — R42 Dizziness and giddiness: Secondary | ICD-10-CM

## 2012-05-05 MED ORDER — GADOBENATE DIMEGLUMINE 529 MG/ML IV SOLN
16.0000 mL | Freq: Once | INTRAVENOUS | Status: AC | PRN
  Administered 2012-05-05: 16 mL via INTRAVENOUS

## 2012-06-18 ENCOUNTER — Other Ambulatory Visit: Payer: Self-pay | Admitting: Family Medicine

## 2012-06-18 DIAGNOSIS — R079 Chest pain, unspecified: Secondary | ICD-10-CM

## 2012-06-21 ENCOUNTER — Ambulatory Visit
Admission: RE | Admit: 2012-06-21 | Discharge: 2012-06-21 | Disposition: A | Discharge status: Home / Self Care | Payer: Medicare Other | Source: Ambulatory Visit | Attending: Family Medicine | Admitting: Family Medicine

## 2012-06-21 DIAGNOSIS — R079 Chest pain, unspecified: Secondary | ICD-10-CM

## 2012-07-06 ENCOUNTER — Encounter (INDEPENDENT_AMBULATORY_CARE_PROVIDER_SITE_OTHER): Payer: Self-pay | Admitting: General Surgery

## 2012-07-08 ENCOUNTER — Encounter (INDEPENDENT_AMBULATORY_CARE_PROVIDER_SITE_OTHER): Payer: Self-pay | Admitting: General Surgery

## 2012-07-08 ENCOUNTER — Ambulatory Visit (INDEPENDENT_AMBULATORY_CARE_PROVIDER_SITE_OTHER): Payer: Medicare Other | Admitting: General Surgery

## 2012-07-08 VITALS — BP 128/84 | HR 83 | Temp 97.6°F | Resp 14 | Ht 71.0 in | Wt 172.2 lb

## 2012-07-08 DIAGNOSIS — K8020 Calculus of gallbladder without cholecystitis without obstruction: Secondary | ICD-10-CM

## 2012-07-08 NOTE — Progress Notes (Signed)
Patient ID: Gregory Pham, male   DOB: 1941/06/03, 71 y.o.   MRN: [id number]  Chief Complaint  Patient presents with  . Abdominal Pain    gallbladder    HPI Gregory Pham is a 71 y.o. male.  Patient is referred by Dr. Tiburcio Pea for gallstones. Patient has a several year history of epigastric abdominal pain. Patient was subsequently worked up for cardiac issues which was determined to be negative. Patient does have a history of SVT this couldn't atenolol. He is also on Plavix secondary to a previous stroke.he states the pain sometimes related to meals. He's had a previous ultrasound and CT scan which revealed multiple stones the largest being 1.1 cm. The patient does not describe any signs or symptoms of nausea vomiting. HPI  Past Medical History  Diagnosis Date  . Adenomatous colon polyp   . Diverticulosis   . Internal hemorrhoids   . Esophageal stricture   . Hypertension   . Hyperlipidemia   . GERD (gastroesophageal reflux disease)   . Gallstones   . PSVT (paroxysmal supraventricular tachycardia)   . COPD (chronic obstructive pulmonary disease)   . Cataract   . Bell's palsy   . CVA (cerebral infarction)   . Plantar fasciitis   . BPH (benign prostatic hypertrophy)   . Renal cyst, right   . Anxiety   . CVD (cardiovascular disease)   . Vitamin D deficiency   . Asthma   . Osteoarthritis   . External hemorrhoids   . HH (hiatus hernia)   . Sleep apnea     Past Surgical History  Procedure Date  . Appendectomy   . Nasal sinus surgery   . Inguinal hernia repair     bilateral  . Colonoscopy   . Upper gastrointestinal endoscopy     Family History  Problem Relation Age of Onset  . Colon cancer Sister   . Diabetes Brother     x 2  . Prostate cancer Brother     x 2  . Breast cancer Sister   . Pancreatic cancer Sister   . Aneurysm Mother   . Heart disease Mother   . Heart disease Father   . Diabetes Mother   . Breast cancer Mother   . Diabetes Father   . Parkinsonism  Father     Social History History  Substance Use Topics  . Smoking status: Never Smoker   . Smokeless tobacco: Never Used  . Alcohol Use: No    Allergies  Allergen Reactions  . Azithromycin   . Cephalexin   . Clarithromycin   . Clindamycin/Lincomycin   . Darvocet (Propoxyphene-Acetaminophen)   . Flexeril (Cyclobenzaprine Hcl)   . Flomax (Tamsulosin Hcl)   . Hydrocod Polst-Cpm Polst Er   . Hydrocodone   . Hyoscyamine   . Levofloxacin   . Naprosyn (Naproxen)   . Skelaxin   . Sulfa Antibiotics     Current Outpatient Prescriptions  Medication Sig Dispense Refill  . atenolol (TENORMIN) 50 MG tablet Take 50 mg by mouth daily.      Marland Kitchen Bioflavonoid Products (ESTER-C) 500-50 MG TABS Take 2 tablets by mouth daily.      . clopidogrel (PLAVIX) 75 MG tablet Take 75 mg by mouth daily.      . Cranberry 500 MG CAPS Take 2 capsules by mouth as directed.       . desonide (DESOWEN) 0.05 % cream       . diclofenac sodium (VOLTAREN) 1 % GEL Apply 1 application  topically 4 (four) times daily.      . fluticasone (FLONASE) 50 MCG/ACT nasal spray Place 1 spray into the nose daily.      . Fluticasone-Salmeterol (ADVAIR DISKUS) 100-50 MCG/DOSE AEPB Inhale 1 puff into the lungs as directed.      . Glucosamine-Chondroit-Vit C-Mn (GLUCOSAMINE 1500 COMPLEX PO) Take 3 capsules by mouth daily with lunch.      . hydrOXYzine (ATARAX/VISTARIL) 10 MG tablet Take 1-2 tablets by mouth every night as needed for insomnia.      . Magnesium 100 MG TABS Take 2 tablets by mouth 4 (four) times daily.       . meclizine (ANTIVERT) 12.5 MG tablet       . Multiple Vitamin (MULTIVITAMIN) tablet Take 1 tablet by mouth daily.      . niacin 500 MG tablet Take 500 mg by mouth 3 (three) times daily with meals.      . nitroGLYCERIN (NITROSTAT) 0.4 MG SL tablet Place 0.4 mg under the tongue every 5 (five) minutes as needed.      Marland Kitchen omega-3 fish oil (MAXEPA) 1000 MG CAPS capsule Take 1 capsule by mouth 3 (three) times daily.        . predniSONE (DELTASONE) 10 MG tablet       . PROCTOSOL HC 2.5 % rectal cream       . RABEprazole (ACIPHEX) 20 MG tablet Take 20 mg by mouth daily.      . Saw Palmetto 450 MG CAPS Take 2 capsules by mouth 3 (three) times daily after meals.      . simvastatin (ZOCOR) 40 MG tablet Take 20 mg by mouth every evening.         Review of Systems Review of Systems  All other systems reviewed and are negative.    Blood pressure 128/84, pulse 83, temperature 97.6 F (36.4 C), temperature source Temporal, resp. rate 14, height 5\' 11"  (1.803 m), weight 172 lb 3.2 oz (78.109 kg).  Physical Exam Physical Exam  Constitutional: He is oriented to person, place, and time. He appears well-developed and well-nourished.  HENT:  Head: Normocephalic and atraumatic.  Eyes: Conjunctivae normal and EOM are normal. Pupils are equal, round, and reactive to light.  Neck: Normal range of motion. Neck supple.  Cardiovascular: Normal rate, regular rhythm and normal heart sounds.   Pulmonary/Chest: Effort normal and breath sounds normal.  Abdominal: Soft. There is no tenderness. There is no rebound and no guarding.  Musculoskeletal: Normal range of motion.  Neurological: He is alert and oriented to person, place, and time.    Data Reviewed Ultrasound which revealed multiple gallstones.  Assessment    The patient is a 71 year old male with multiple gallstones and symptomatic cholelithiasis.    Plan    1. Patient will require cardiac clearance prior to scheduling. Patient will also have to be off of Plavix for 7-10 days.  2. After cardiac clearance we'll proceed to the operating room for laparoscopic cholecystectomy with intraoperative cholangiogram.  3. All risks and benefits were discussed with the patient, to generally include infection, bleeding, damage to surrounding structures, and recurrence. Alternatives were offered and described.  All questions were answered and the patient voiced understanding  of the procedure and wishes to proceed at this point.        Marigene Ehlers., Makih Stefanko 07/08/2012, 9:46 AM

## 2012-07-09 ENCOUNTER — Telehealth (INDEPENDENT_AMBULATORY_CARE_PROVIDER_SITE_OTHER): Payer: Self-pay

## 2012-07-09 ENCOUNTER — Encounter (INDEPENDENT_AMBULATORY_CARE_PROVIDER_SITE_OTHER): Payer: Self-pay | Admitting: General Surgery

## 2012-07-09 NOTE — Telephone Encounter (Signed)
Sandy from Neuro called stating she received a clearance request re:pt for surgery from [facility]. Andrey Campanile states they have not seen pt since 2010. She advised [place] to check with pts PCP to see who manages pts palvix. Andrey Campanile can be reached at [phone] with any questions. I advised her I will send msg to [place].

## 2012-07-20 ENCOUNTER — Encounter (INDEPENDENT_AMBULATORY_CARE_PROVIDER_SITE_OTHER): Payer: Self-pay

## 2012-07-20 ENCOUNTER — Other Ambulatory Visit (INDEPENDENT_AMBULATORY_CARE_PROVIDER_SITE_OTHER): Payer: Self-pay | Admitting: General Surgery

## 2012-07-29 ENCOUNTER — Encounter (HOSPITAL_COMMUNITY): Payer: Self-pay | Admitting: Pharmacist

## 2012-08-04 ENCOUNTER — Telehealth (INDEPENDENT_AMBULATORY_CARE_PROVIDER_SITE_OTHER): Payer: Self-pay

## 2012-08-04 ENCOUNTER — Encounter (HOSPITAL_COMMUNITY)
Admission: RE | Admit: 2012-08-04 | Discharge: 2012-08-04 | Disposition: A | Discharge status: Home / Self Care | Payer: Medicare Other | Source: Ambulatory Visit | Attending: General Surgery | Admitting: General Surgery

## 2012-08-04 ENCOUNTER — Encounter (HOSPITAL_COMMUNITY): Payer: Self-pay

## 2012-08-04 LAB — CBC
Platelets: 140 10*3/uL — ABNORMAL LOW (ref 150–400)
RBC: 5.43 MIL/uL (ref 4.22–5.81)
WBC: 6.2 10*3/uL (ref 4.0–10.5)

## 2012-08-04 LAB — SURGICAL PCR SCREEN
MRSA, PCR: NEGATIVE
Staphylococcus aureus: NEGATIVE

## 2012-08-04 LAB — BASIC METABOLIC PANEL
Calcium: 9.5 mg/dL (ref 8.4–10.5)
GFR calc Af Amer: >90 mL/min (ref >90)
GFR calc non Af Amer: 84 mL/min — ABNORMAL LOW (ref >90)
Potassium: 4.5 mEq/L (ref 3.5–5.1)
Sodium: 140 mEq/L (ref 135–145)

## 2012-08-04 MED ORDER — CHLORHEXIDINE GLUCONATE 4 % EX LIQD: 1.0000 "application " | Freq: Once | CUTANEOUS | Status: DC

## 2012-08-04 NOTE — Telephone Encounter (Signed)
Pre-op calling for confirmation on antibiotics on surgical order.  Please advise.

## 2012-08-04 NOTE — Progress Notes (Signed)
Talked with [facility] nursing desk concerning pt's allergies and antibiotics to be ordered. Stated she would notify MD.

## 2012-08-04 NOTE — Pre-Procedure Instructions (Signed)
20 HENDRY SPEAS  08/04/2012   Your procedure is scheduled on:  Thursday December 26 at 1505 PM  Report to [facility] at 1300 PM.  Call this number if you have problems the morning of surgery: [phone]   Remember:   Do not eat food or drink:After Midnight.Wednesday      Take these medicines the morning of surgery with A SIP OF WATER: Atenolol,and Aciphex. Bring inhaler Proventil and Advair with you day of surgery, also Flonase nasal spray   Do not wear jewelry  Do not wear lotions.You may wear deodorant.             Men may shave face and neck.  Do not bring valuables to the hospital.  Contacts, dentures or bridgework may not be worn into surgery.  Leave suitcase in the car. After surgery it may be brought to your room.  For patients admitted to the hospital, checkout time is 11:00 AM the day of discharge.   Patients discharged the day of surgery will not be allowed to drive home.  Name and phone number of your driver: Son  Special Instructions: Shower using CHG 2 nights before surgery and the night before surgery.  If you shower the day of surgery use CHG.  Use special wash - you have one bottle of CHG for all showers.  You should use approximately 1/3 of the bottle for each shower.   Please read over the following fact sheets that you were given: Pain Booklet, Coughing and Deep Breathing, MRSA Information and Surgical Site Infection Prevention

## 2012-08-05 NOTE — Consult Note (Signed)
Anesthesia chart review: Patient is a 71 year old male scheduled for laparoscopic cholecystectomy with intraoperative cholangiogram on 08/12/2012 by Dr. Derrell Lolling. History includes nonsmoker, hyperlipidemia, hypertension, esophageal stricture, GERD, PSVT (on b-blocker therapy), COPD, Bell's palsy, cataracts, CVA '10, BPH, anxiety, asthma, hiatal hernia, ulcer arthritis.  PCP is Dr. Tawni Pummel. Harris/C. Elvera Lennox, PA-C who have cleared him for this procedure.  Per his medical clearance note dated, 07/13/12, In addition to a recent negative stress test, "he also had an MRI of his brain and carotid ultrasound because of an episode of dizziness. These were both negative/unchanged. They did not reveal any new cerebrovascular changes.  I feel the patient is healthy to undergo surgery in an elective state on his gallbladder."  He was also evaluated by Cardiologist Dr. Armanda Magic on 08/02/12 for follow-up chest pain with work-up negative for ischemia.  Ultimately, his chest pain was felt most likely from his gallbladder. She felt he is low risk from a cardiovascular standpoint to have cholecystectomy.  PR[facility] Cardiology follow-up was recommended.  EKG on 08/04/2012 showed sinus bradycardia with sinus arrhythmia.  Nuclear stress test on 05/20/2012 [facility]) showed normal myocardial perfusion, poststress EF 79%. No regional wall motion abnormalities.  Echocardiogram on 07/23/2009 showed: Left ventricle: The cavity size was normal. Wall thickness was normal. The estimated ejection fraction was 65%. Wall motion was normal; there were no regional wall motion abnormalities.  Chest x-ray on 08/04/2012 showed no active lung disease. Hyperaeration.  Preoperative labs noted.  If no significant change in his status then anticipate he can proceed as planned.  Shonna Chock, PA-C 08/05/12 1735

## 2012-08-09 ENCOUNTER — Telehealth (INDEPENDENT_AMBULATORY_CARE_PROVIDER_SITE_OTHER): Payer: Self-pay | Admitting: General Surgery

## 2012-08-09 NOTE — Telephone Encounter (Signed)
Pt called to report he is taking nighttime cold and flu medicine for URI symptoms.  So far he is afebrile.  Asking if he should be on antibiotics, since he will have surgery on Thursday.  Reassured pt that antibiotics will not treat a viral illness, so he should continue his current OTC regimen.  Also push po fluids AMAP.  Report for surgery as scheduled.

## 2012-08-11 MED ORDER — VANCOMYCIN HCL IN DEXTROSE 1-5 GM/200ML-% IV SOLN
1000.0000 mg | INTRAVENOUS | Status: AC
  Administered 2012-08-12: 1000 mg via INTRAVENOUS
  Filled 2012-08-11: qty 200

## 2012-08-12 ENCOUNTER — Ambulatory Visit (HOSPITAL_COMMUNITY)
Admission: RE | Admit: 2012-08-12 | Discharge: 2012-08-12 | Disposition: A | Discharge status: Home / Self Care | Payer: Medicare Other | Source: Ambulatory Visit | Attending: General Surgery | Admitting: General Surgery

## 2012-08-12 ENCOUNTER — Encounter (HOSPITAL_COMMUNITY): Payer: Self-pay | Admitting: Certified Registered"

## 2012-08-12 ENCOUNTER — Encounter (HOSPITAL_COMMUNITY): Payer: Self-pay | Admitting: Vascular Surgery

## 2012-08-12 ENCOUNTER — Ambulatory Visit (HOSPITAL_COMMUNITY): Payer: Medicare Other | Admitting: Vascular Surgery

## 2012-08-12 ENCOUNTER — Encounter (HOSPITAL_COMMUNITY): Payer: Self-pay | Admitting: *Deleted

## 2012-08-12 ENCOUNTER — Telehealth (INDEPENDENT_AMBULATORY_CARE_PROVIDER_SITE_OTHER): Payer: Self-pay

## 2012-08-12 ENCOUNTER — Encounter (HOSPITAL_COMMUNITY)
Admission: RE | Disposition: A | Discharge status: Home / Self Care | Payer: Self-pay | Source: Ambulatory Visit | Attending: General Surgery

## 2012-08-12 ENCOUNTER — Ambulatory Visit (HOSPITAL_COMMUNITY): Payer: Medicare Other

## 2012-08-12 DIAGNOSIS — E785 Hyperlipidemia, unspecified: Secondary | ICD-10-CM | POA: Insufficient documentation

## 2012-08-12 DIAGNOSIS — Z9089 Acquired absence of other organs: Secondary | ICD-10-CM | POA: Insufficient documentation

## 2012-08-12 DIAGNOSIS — I739 Peripheral vascular disease, unspecified: Secondary | ICD-10-CM | POA: Insufficient documentation

## 2012-08-12 DIAGNOSIS — N400 Enlarged prostate without lower urinary tract symptoms: Secondary | ICD-10-CM | POA: Insufficient documentation

## 2012-08-12 DIAGNOSIS — Z803 Family history of malignant neoplasm of breast: Secondary | ICD-10-CM | POA: Insufficient documentation

## 2012-08-12 DIAGNOSIS — M1990 Unspecified osteoarthritis, unspecified site: Secondary | ICD-10-CM | POA: Insufficient documentation

## 2012-08-12 DIAGNOSIS — H269 Unspecified cataract: Secondary | ICD-10-CM | POA: Insufficient documentation

## 2012-08-12 DIAGNOSIS — K449 Diaphragmatic hernia without obstruction or gangrene: Secondary | ICD-10-CM | POA: Insufficient documentation

## 2012-08-12 DIAGNOSIS — K8066 Calculus of gallbladder and bile duct with acute and chronic cholecystitis without obstruction: Secondary | ICD-10-CM

## 2012-08-12 DIAGNOSIS — K8010 Calculus of gallbladder with chronic cholecystitis without obstruction: Secondary | ICD-10-CM | POA: Insufficient documentation

## 2012-08-12 DIAGNOSIS — K219 Gastro-esophageal reflux disease without esophagitis: Secondary | ICD-10-CM | POA: Insufficient documentation

## 2012-08-12 DIAGNOSIS — E559 Vitamin D deficiency, unspecified: Secondary | ICD-10-CM | POA: Insufficient documentation

## 2012-08-12 DIAGNOSIS — Z8249 Family history of ischemic heart disease and other diseases of the circulatory system: Secondary | ICD-10-CM | POA: Insufficient documentation

## 2012-08-12 DIAGNOSIS — Z833 Family history of diabetes mellitus: Secondary | ICD-10-CM | POA: Insufficient documentation

## 2012-08-12 DIAGNOSIS — J449 Chronic obstructive pulmonary disease, unspecified: Secondary | ICD-10-CM | POA: Insufficient documentation

## 2012-08-12 DIAGNOSIS — J4489 Other specified chronic obstructive pulmonary disease: Secondary | ICD-10-CM | POA: Insufficient documentation

## 2012-08-12 DIAGNOSIS — K8000 Calculus of gallbladder with acute cholecystitis without obstruction: Secondary | ICD-10-CM | POA: Insufficient documentation

## 2012-08-12 DIAGNOSIS — G4730 Sleep apnea, unspecified: Secondary | ICD-10-CM | POA: Insufficient documentation

## 2012-08-12 DIAGNOSIS — I10 Essential (primary) hypertension: Secondary | ICD-10-CM | POA: Insufficient documentation

## 2012-08-12 DIAGNOSIS — Z800 Family history of malignant neoplasm of digestive organs: Secondary | ICD-10-CM | POA: Insufficient documentation

## 2012-08-12 DIAGNOSIS — Z8673 Personal history of transient ischemic attack (TIA), and cerebral infarction without residual deficits: Secondary | ICD-10-CM | POA: Insufficient documentation

## 2012-08-12 HISTORY — PX: CHOLECYSTECTOMY: SHX55

## 2012-08-12 SURGERY — LAPAROSCOPIC CHOLECYSTECTOMY WITH INTRAOPERATIVE CHOLANGIOGRAM
Anesthesia: General | Site: Abdomen | Wound class: Clean Contaminated

## 2012-08-12 MED ORDER — CHLORHEXIDINE GLUCONATE 4 % EX LIQD: 1.0000 "application " | Freq: Once | CUTANEOUS | Status: DC

## 2012-08-12 MED ORDER — CEFAZOLIN SODIUM-DEXTROSE 2-3 GM-% IV SOLR: 2.0000 g | INTRAVENOUS | Status: DC

## 2012-08-12 MED ORDER — BUPIVACAINE HCL (PF) 0.25 % IJ SOLN
INTRAMUSCULAR | Status: AC
  Filled 2012-08-12: qty 30

## 2012-08-12 MED ORDER — OXYCODONE HCL 5 MG PO TABS
5.0000 mg | ORAL_TABLET | ORAL | Status: DC | PRN
  Administered 2012-08-12: 10 mg via ORAL

## 2012-08-12 MED ORDER — NEOSTIGMINE METHYLSULFATE 1 MG/ML IJ SOLN
INTRAMUSCULAR | Status: DC | PRN
  Administered 2012-08-12: 4 mg via INTRAVENOUS

## 2012-08-12 MED ORDER — SODIUM CHLORIDE 0.9 % IJ SOLN: 3.0000 mL | Freq: Two times a day (BID) | INTRAMUSCULAR | Status: DC

## 2012-08-12 MED ORDER — FENTANYL CITRATE 0.05 MG/ML IJ SOLN
INTRAMUSCULAR | Status: AC
  Filled 2012-08-12: qty 2

## 2012-08-12 MED ORDER — MIDAZOLAM HCL 5 MG/5ML IJ SOLN
INTRAMUSCULAR | Status: DC | PRN
  Administered 2012-08-12: 1 mg via INTRAVENOUS

## 2012-08-12 MED ORDER — SODIUM CHLORIDE 0.9 % IV SOLN: 250.0000 mL | INTRAVENOUS | Status: DC | PRN

## 2012-08-12 MED ORDER — FENTANYL CITRATE 0.05 MG/ML IJ SOLN
25.0000 ug | INTRAMUSCULAR | Status: DC | PRN
  Administered 2012-08-12: 50 ug via INTRAVENOUS

## 2012-08-12 MED ORDER — SODIUM CHLORIDE 0.9 % IJ SOLN: 3.0000 mL | INTRAMUSCULAR | Status: DC | PRN

## 2012-08-12 MED ORDER — GLYCOPYRROLATE 0.2 MG/ML IJ SOLN
INTRAMUSCULAR | Status: DC | PRN
  Administered 2012-08-12: 0.4 mg via INTRAVENOUS

## 2012-08-12 MED ORDER — ROCURONIUM BROMIDE 100 MG/10ML IV SOLN
INTRAVENOUS | Status: DC | PRN
  Administered 2012-08-12: 40 mg via INTRAVENOUS

## 2012-08-12 MED ORDER — ONDANSETRON HCL 4 MG/2ML IJ SOLN
INTRAMUSCULAR | Status: DC | PRN
  Administered 2012-08-12: 4 mg via INTRAVENOUS

## 2012-08-12 MED ORDER — ACETAMINOPHEN 650 MG RE SUPP: 650.0000 mg | RECTAL | Status: DC | PRN

## 2012-08-12 MED ORDER — OXYCODONE HCL 5 MG PO TABS
ORAL_TABLET | ORAL | Status: AC
  Filled 2012-08-12: qty 2

## 2012-08-12 MED ORDER — SODIUM CHLORIDE 0.9 % IR SOLN
Status: DC | PRN
  Administered 2012-08-12: 1000 mL

## 2012-08-12 MED ORDER — LACTATED RINGERS IV SOLN
INTRAVENOUS | Status: DC
  Administered 2012-08-12: 50 mL/h via INTRAVENOUS

## 2012-08-12 MED ORDER — DEXAMETHASONE SODIUM PHOSPHATE 4 MG/ML IJ SOLN
INTRAMUSCULAR | Status: DC | PRN
  Administered 2012-08-12: 8 mg via INTRAVENOUS

## 2012-08-12 MED ORDER — METOCLOPRAMIDE HCL 5 MG/ML IJ SOLN: 10.0000 mg | Freq: Once | INTRAMUSCULAR | Status: DC | PRN

## 2012-08-12 MED ORDER — LACTATED RINGERS IV SOLN
INTRAVENOUS | Status: DC | PRN
  Administered 2012-08-12 (×2): via INTRAVENOUS

## 2012-08-12 MED ORDER — BUPIVACAINE HCL 0.25 % IJ SOLN
INTRAMUSCULAR | Status: DC | PRN
  Administered 2012-08-12: 11 mL

## 2012-08-12 MED ORDER — 0.9 % SODIUM CHLORIDE (POUR BTL) OPTIME
TOPICAL | Status: DC | PRN
  Administered 2012-08-12: 1000 mL

## 2012-08-12 MED ORDER — LIDOCAINE HCL (CARDIAC) 20 MG/ML IV SOLN
INTRAVENOUS | Status: DC | PRN
  Administered 2012-08-12: 50 mg via INTRAVENOUS

## 2012-08-12 MED ORDER — OXYCODONE HCL 5 MG PO TABS: 5.0000 mg | ORAL_TABLET | ORAL | Status: DC | PRN

## 2012-08-12 MED ORDER — ACETAMINOPHEN 325 MG PO TABS: 650.0000 mg | ORAL_TABLET | ORAL | Status: DC | PRN

## 2012-08-12 MED ORDER — IOHEXOL 300 MG/ML  SOLN
INTRAMUSCULAR | Status: DC | PRN
  Administered 2012-08-12: 5 mL

## 2012-08-12 MED ORDER — PROPOFOL 10 MG/ML IV BOLUS
INTRAVENOUS | Status: DC | PRN
  Administered 2012-08-12: 200 mg via INTRAVENOUS

## 2012-08-12 MED ORDER — FENTANYL CITRATE 0.05 MG/ML IJ SOLN
INTRAMUSCULAR | Status: DC | PRN
  Administered 2012-08-12 (×4): 50 ug via INTRAVENOUS

## 2012-08-12 MED ORDER — ONDANSETRON HCL 4 MG/2ML IJ SOLN: 4.0000 mg | Freq: Four times a day (QID) | INTRAMUSCULAR | Status: DC | PRN

## 2012-08-12 SURGICAL SUPPLY — 47 items
APL SKNCLS STERI-STRIP NONHPOA (GAUZE/BANDAGES/DRESSINGS) ×1
APPLIER CLIP 5 13 M/L LIGAMAX5 (MISCELLANEOUS) ×2
APR CLP MED LRG 5 ANG JAW (MISCELLANEOUS) ×1
BAG SPEC RTRVL LRG 6X4 10 (ENDOMECHANICALS)
BENZOIN TINCTURE PRP APPL 2/3 (GAUZE/BANDAGES/DRESSINGS) ×2 IMPLANT
CANISTER SUCTION 2500CC (MISCELLANEOUS) ×2 IMPLANT
CHLORAPREP W/TINT 26ML (MISCELLANEOUS) ×3 IMPLANT
CLIP APPLIE 5 13 M/L LIGAMAX5 (MISCELLANEOUS) ×1 IMPLANT
CLOTH BEACON ORANGE TIMEOUT ST (SAFETY) ×2 IMPLANT
COVER MAYO STAND STRL (DRAPES) ×2 IMPLANT
COVER SURGICAL LIGHT HANDLE (MISCELLANEOUS) ×2 IMPLANT
COVER TRANSDUCER ULTRASND (DRAPES) ×1 IMPLANT
DEVICE TROCAR PUNCTURE CLOSURE (ENDOMECHANICALS) ×2 IMPLANT
DRAPE C-ARM 42X72 X-RAY (DRAPES) ×2 IMPLANT
DRAPE UTILITY 15X26 W/TAPE STR (DRAPE) ×4 IMPLANT
ELECT REM PT RETURN 9FT ADLT (ELECTROSURGICAL) ×2
ELECTRODE REM PT RTRN 9FT ADLT (ELECTROSURGICAL) ×1 IMPLANT
GAUZE SPONGE 2X2 8PLY STRL LF (GAUZE/BANDAGES/DRESSINGS) ×1 IMPLANT
GLOVE BIO SURGEON STRL SZ7.5 (GLOVE) ×5 IMPLANT
GLOVE BIOGEL PI IND STRL 7.0 (GLOVE) IMPLANT
GLOVE BIOGEL PI IND STRL 7.5 (GLOVE) IMPLANT
GLOVE BIOGEL PI INDICATOR 7.0 (GLOVE) ×1
GLOVE BIOGEL PI INDICATOR 7.5 (GLOVE) ×1
GLOVE ECLIPSE 6.5 STRL STRAW (GLOVE) ×1 IMPLANT
GOWN STRL NON-REIN LRG LVL3 (GOWN DISPOSABLE) ×5 IMPLANT
GOWN STRL REIN XL XLG (GOWN DISPOSABLE) ×2 IMPLANT
IV CATH 14GX2 1/4 (CATHETERS) ×2 IMPLANT
KIT BASIN OR (CUSTOM PROCEDURE TRAY) ×2 IMPLANT
KIT ROOM TURNOVER OR (KITS) ×2 IMPLANT
NDL INSUFFLATION 14GA 120MM (NEEDLE) ×1 IMPLANT
NEEDLE INSUFFLATION 14GA 120MM (NEEDLE) ×2 IMPLANT
NS IRRIG 1000ML POUR BTL (IV SOLUTION) ×2 IMPLANT
PAD ARMBOARD 7.5X6 YLW CONV (MISCELLANEOUS) ×4 IMPLANT
POUCH SPECIMEN RETRIEVAL 10MM (ENDOMECHANICALS) IMPLANT
SCISSORS LAP 5X35 DISP (ENDOMECHANICALS) ×2 IMPLANT
SET CHOLANGIOGRAPHY FRANKLIN (SET/KITS/TRAYS/PACK) ×2 IMPLANT
SET IRRIG TUBING LAPAROSCOPIC (IRRIGATION / IRRIGATOR) ×2 IMPLANT
SLEEVE ENDOPATH XCEL 5M (ENDOMECHANICALS) ×4 IMPLANT
SPECIMEN JAR MEDIUM (MISCELLANEOUS) ×1 IMPLANT
SPECIMEN JAR SMALL (MISCELLANEOUS) ×1 IMPLANT
SPONGE GAUZE 2X2 STER 10/PKG (GAUZE/BANDAGES/DRESSINGS) ×1
SUT MNCRL AB 3-0 PS2 18 (SUTURE) ×2 IMPLANT
TOWEL OR 17X24 6PK STRL BLUE (TOWEL DISPOSABLE) ×2 IMPLANT
TOWEL OR 17X26 10 PK STRL BLUE (TOWEL DISPOSABLE) ×2 IMPLANT
TRAY LAPAROSCOPIC (CUSTOM PROCEDURE TRAY) ×2 IMPLANT
TROCAR XCEL NON-BLD 11X100MML (ENDOMECHANICALS) ×2 IMPLANT
TROCAR XCEL NON-BLD 5MMX100MML (ENDOMECHANICALS) ×2 IMPLANT

## 2012-08-12 NOTE — Telephone Encounter (Signed)
Per Dr Derrell Lolling request rx written by Dr Daphine Deutscher for oxycodone 5/325 # 30. Rx at front desk for pick up.

## 2012-08-12 NOTE — Preoperative (Signed)
Beta Blockers   Reason not to administer Beta Blockers:Not Applicable, pt took atenolol 12/26

## 2012-08-12 NOTE — H&P (Addendum)
Chief Complaint   Patient presents with   .  Abdominal Pain     gallbladder   HPI  Gregory Pham is a 71 y.o. male. Patient is referred by Dr. Tiburcio Pea for gallstones. Patient has a several year history of epigastric abdominal pain. Patient was subsequently worked up for cardiac issues which was determined to be negative. Patient does have a history of SVT this couldn't atenolol. He is also on Plavix secondary to a previous stroke.he states the pain sometimes related to meals. He's had a previous ultrasound and CT scan which revealed multiple stones the largest being 1.1 cm. The patient does not describe any signs or symptoms of nausea vomiting.  HPI  Past Medical History   Diagnosis  Date   .  Adenomatous colon polyp    .  Diverticulosis    .  Internal hemorrhoids    .  Esophageal stricture    .  Hypertension    .  Hyperlipidemia    .  GERD (gastroesophageal reflux disease)    .  Gallstones    .  PSVT (paroxysmal supraventricular tachycardia)    .  COPD (chronic obstructive pulmonary disease)    .  Cataract    .  Bell's palsy    .  CVA (cerebral infarction)    .  Plantar fasciitis    .  BPH (benign prostatic hypertrophy)    .  Renal cyst, right    .  Anxiety    .  CVD (cardiovascular disease)    .  Vitamin D deficiency    .  Asthma    .  Osteoarthritis    .  External hemorrhoids    .  HH (hiatus hernia)    .  Sleep apnea     Past Surgical History   Procedure  Date   .  Appendectomy    .  Nasal sinus surgery    .  Inguinal hernia repair      bilateral   .  Colonoscopy    .  Upper gastrointestinal endoscopy     Family History   Problem  Relation  Age of Onset   .  Colon cancer  Sister    .  Diabetes  Brother       x 2    .  Prostate cancer  Brother       x 2    .  Breast cancer  Sister    .  Pancreatic cancer  Sister    .  Aneurysm  Mother    .  Heart disease  Mother    .  Heart disease  Father    .  Diabetes  Mother    .  Breast cancer  Mother    .   Diabetes  Father    .  Parkinsonism  Father    Social History  History   Substance Use Topics   .  Smoking status:  Never Smoker   .  Smokeless tobacco:  Never Used   .  Alcohol Use:  No    Allergies   Allergen  Reactions   .  Azithromycin    .  Cephalexin    .  Clarithromycin    .  Clindamycin/Lincomycin    .  Darvocet (Propoxyphene-Acetaminophen)    .  Flexeril (Cyclobenzaprine Hcl)    .  Flomax (Tamsulosin Hcl)    .  Hydrocod Polst-Cpm Polst Er    .  Hydrocodone    .  Hyoscyamine    .  Levofloxacin    .  Naprosyn (Naproxen)    .  Skelaxin    .  Sulfa Antibiotics     Current Outpatient Prescriptions   Medication  Sig  Dispense  Refill   .  atenolol (TENORMIN) 50 MG tablet  Take 50 mg by mouth daily.     Marland Kitchen  Bioflavonoid Products (ESTER-C) 500-50 MG TABS  Take 2 tablets by mouth daily.     .  clopidogrel (PLAVIX) 75 MG tablet  Take 75 mg by mouth daily.     .  Cranberry 500 MG CAPS  Take 2 capsules by mouth as directed.     .  desonide (DESOWEN) 0.05 % cream      .  diclofenac sodium (VOLTAREN) 1 % GEL  Apply 1 application topically 4 (four) times daily.     .  fluticasone (FLONASE) 50 MCG/ACT nasal spray  Place 1 spray into the nose daily.     .  Fluticasone-Salmeterol (ADVAIR DISKUS) 100-50 MCG/DOSE AEPB  Inhale 1 puff into the lungs as directed.     .  Glucosamine-Chondroit-Vit C-Mn (GLUCOSAMINE 1500 COMPLEX PO)  Take 3 capsules by mouth daily with lunch.     .  hydrOXYzine (ATARAX/VISTARIL) 10 MG tablet  Take 1-2 tablets by mouth every night as needed for insomnia.     .  Magnesium 100 MG TABS  Take 2 tablets by mouth 4 (four) times daily.     .  meclizine (ANTIVERT) 12.5 MG tablet      .  Multiple Vitamin (MULTIVITAMIN) tablet  Take 1 tablet by mouth daily.     .  niacin 500 MG tablet  Take 500 mg by mouth 3 (three) times daily with meals.     .  nitroGLYCERIN (NITROSTAT) 0.4 MG SL tablet  Place 0.4 mg under the tongue every 5 (five) minutes as needed.     Marland Kitchen  omega-3  fish oil (MAXEPA) 1000 MG CAPS capsule  Take 1 capsule by mouth 3 (three) times daily.     .  predniSONE (DELTASONE) 10 MG tablet      .  PROCTOSOL HC 2.5 % rectal cream      .  RABEprazole (ACIPHEX) 20 MG tablet  Take 20 mg by mouth daily.     .  Saw Palmetto 450 MG CAPS  Take 2 capsules by mouth 3 (three) times daily after meals.     .  simvastatin (ZOCOR) 40 MG tablet  Take 20 mg by mouth every evening.     Review of Systems  Review of Systems  All other systems reviewed and are negative.  Blood pressure 128/84, pulse 83, temperature 97.6 F (36.4 C), temperature source Temporal, resp. rate 14, height 5\' 11"  (1.803 m), weight 172 lb 3.2 oz (78.109 kg).  Physical Exam  Physical Exam  Constitutional: He is oriented to person, place, and time. He appears well-developed and well-nourished.  HENT:  Head: Normocephalic and atraumatic.  Eyes: Conjunctivae normal and EOM are normal. Pupils are equal, round, and reactive to light.  Neck: Normal range of motion. Neck supple.  Cardiovascular: Normal rate, regular rhythm and normal heart sounds.  Pulmonary/Chest: Effort normal and breath sounds normal.  Abdominal: Soft. There is no tenderness. There is no rebound and no guarding.  Musculoskeletal: Normal range of motion.  Neurological: He is alert and oriented to person, place, and time.  Data Reviewed  Ultrasound which revealed multiple gallstones.  Assessment  The patient is a 71 year old male with multiple gallstones and symptomatic cholelithiasis.  Plan  1.  OR for lap chole  2. All risks and benefits were discussed with the patient, to generally include infection, bleeding, damage to surrounding structures, and recurrence. Alternatives were offered and described. All questions were answered and the patient voiced understanding of the procedure and wishes to proceed at this point.

## 2012-08-12 NOTE — Anesthesia Procedure Notes (Signed)
Procedure Name: Intubation Date/Time: 08/12/2012 1:58 PM Performed by: Ellin Goodie Pre-anesthesia Checklist: Patient identified, Emergency Drugs available, Suction available, Patient being monitored and Timeout performed Patient Re-evaluated:Patient Re-evaluated prior to inductionOxygen Delivery Method: Circle system utilized Preoxygenation: Pre-oxygenation with 100% oxygen Intubation Type: IV induction Ventilation: Mask ventilation without difficulty Laryngoscope Size: Mac and 4 Grade View: Grade IV Tube type: Oral Tube size: 7.5 mm Number of attempts: 2 Airway Equipment and Method: Stylet Placement Confirmation: ETT inserted through vocal cords under direct vision,  positive ETCO2 and breath sounds checked- equal and bilateral Secured at: 23 cm Tube secured with: Tape Dental Injury: Teeth and Oropharynx as per pre-operative assessment  Difficulty Due To: Difficulty was unanticipated Future Recommendations: Recommend- induction with short-acting agent, and alternative techniques readily available

## 2012-08-12 NOTE — Anesthesia Preprocedure Evaluation (Addendum)
Anesthesia Evaluation  Patient identified by MRN, date of birth, ID band Patient awake    Reviewed: Allergy & Precautions, H&P , NPO status , Patient's Chart, lab work & pertinent test results, reviewed documented beta blocker date and time   Airway Mallampati: II TM Distance: >3 FB Neck ROM: full    Dental  (+) Dental Advisory Given and Teeth Intact   Pulmonary asthma , COPD COPD inhaler,  breath sounds clear to auscultation        Cardiovascular hypertension, Pt. on home beta blockers and On Medications + Peripheral Vascular Disease + dysrhythmias (hx svt) Rhythm:regular Rate:Normal     Neuro/Psych PSYCHIATRIC DISORDERS Anxiety  Neuromuscular disease    GI/Hepatic negative GI ROS, Neg liver ROS, hiatal hernia, GERD-  ,  Endo/Other  negative endocrine ROS  Renal/GU Renal disease  negative genitourinary   Musculoskeletal   Abdominal (+)  Abdomen: soft. Bowel sounds: normal.  Peds  Hematology negative hematology ROS (+)   Anesthesia Other Findings See surgeon's H&P Cleared for surgery by Dr. Mayford Knife, cardiologist.  Carlynn Herald, CRNA  Reproductive/Obstetrics negative OB ROS                        Anesthesia Physical Anesthesia Plan  ASA: III  Anesthesia Plan: General   Post-op Pain Management:    Induction: Intravenous  Airway Management Planned: Oral ETT  Additional Equipment:   Intra-op Plan:   Post-operative Plan: Extubation in OR  Informed Consent: I have reviewed the patients History and Physical, chart, labs and discussed the procedure including the risks, benefits and alternatives for the proposed anesthesia with the patient or authorized representative who has indicated his/her understanding and acceptance.   Dental advisory given  Plan Discussed with: Anesthesiologist and Surgeon  Anesthesia Plan Comments:        Anesthesia Quick Evaluation

## 2012-08-12 NOTE — Op Note (Signed)
Pre Operative Diagnosis: sx gallstones   Post Operative Diagnosis: same  Surgeon: Dr. Axel Filler   Procedure: lap chole with IOC  Assistant: none  Anesthesia: Gen. Endotracheal anesthesia   EBL: 20cc  Complications:  Counts: reported as correct x 2   Findings: The patient had normal IOC  Indications for procedure: Pt is a 71 y/o M with sx gallstones.  Pt decided to have this electively removed after informed consent.  Details of the procedure:  The patient was taken to the operating and placed in the supine position with bilateral SCDs in place. A time out was called and all facts were verified. A pneumoperitoneum was obtained via A Veress needle technique to a pressure of 14mm of mercury. A 5mm trochar was then placed in the right upper quadrant under visualization, and there were no injuries to any abdominal organs. A 11 mm port was then placed in the umbilical region after infiltrating with local anesthesia under direct visualization. A second and third epigastric port and right lower quadrant port placement under direct visualization, respectively. The gallbladder was identified and retracted, the peritoneum was then sharply dissected from the gallbladder and this dissection was carried down to Calot's triangle. The gallbladder was identified and stripped away circumferentially and seen going into the gallbladder 360. A Cook catheter was used to perform an intraoperative cholangiogram. The biliary radicals as well as the cystic duct and common bile duct were seen free of filling defects.  2 clips were placed proximally one distally and the cystic duct transected. The cystic artery was identified and 2 clips placed proximally and one distally and transected.  We then proceeded to remove the gallbladder off the hepatic fossa with Bovie cautery. An latex retrieval bag was then placed in the abdomen and gallbladder placed in the bag. The hepatic fossa was then reexamined and hemostasis  was achieved with Bovie cautery and was excellent at the end of the case. The subhepatic fossa and perihepatic fossa was then irrigated until the effluent was clear. The 11 mm trocar fascia was reapproximated with the Endo Close #1 Vicryl.  The pneumoperitoneum was evacuated and all trochars removed under direct visulalization.  The skin was then closed with 4-0 Monocryl and the skin dressed with Steri-Strips, gauze, and tape.  The patient was awaken from general anesthesia and taken to the recovery room in stable condition.

## 2012-08-12 NOTE — Transfer of Care (Signed)
Immediate Anesthesia Transfer of Care Note  Patient: Gregory Pham  Procedure(s) Performed: Procedure(s) (LRB) with comments: LAPAROSCOPIC CHOLECYSTECTOMY WITH INTRAOPERATIVE CHOLANGIOGRAM (N/A)  Patient Location: PACU  Anesthesia Type:General  Level of Consciousness: sedated  Airway & Oxygen Therapy: Patient Spontanous Breathing  Post-op Assessment: Report given to PACU RN  Post vital signs: stable  Complications: No apparent anesthesia complications

## 2012-08-13 NOTE — Anesthesia Postprocedure Evaluation (Signed)
Anesthesia Post Note  Patient: Gregory Pham  Procedure(s) Performed: Procedure(s) (LRB): LAPAROSCOPIC CHOLECYSTECTOMY WITH INTRAOPERATIVE CHOLANGIOGRAM (N/A)  Anesthesia type: general  Patient location: PACU  Post pain: Pain level controlled  Post assessment: Patient's Cardiovascular Status Stable  Last Vitals:  Filed Vitals:   08/12/12 1700  BP: 117/81  Pulse: 85  Temp: 37.1 C  Resp: 16    Post vital signs: Reviewed and stable  Level of consciousness: sedated  Complications: No apparent anesthesia complications

## 2012-08-16 ENCOUNTER — Encounter (HOSPITAL_COMMUNITY): Payer: Self-pay | Admitting: General Surgery

## 2012-08-26 ENCOUNTER — Ambulatory Visit (INDEPENDENT_AMBULATORY_CARE_PROVIDER_SITE_OTHER): Payer: Medicare Other | Admitting: General Surgery

## 2012-08-26 ENCOUNTER — Encounter (INDEPENDENT_AMBULATORY_CARE_PROVIDER_SITE_OTHER): Payer: Self-pay | Admitting: General Surgery

## 2012-08-26 VITALS — BP 123/70 | HR 74 | Temp 97.4°F | Resp 12 | Ht 71.0 in | Wt 170.0 lb

## 2012-08-26 DIAGNOSIS — Z9049 Acquired absence of other specified parts of digestive tract: Secondary | ICD-10-CM

## 2012-08-26 DIAGNOSIS — Z9889 Other specified postprocedural states: Secondary | ICD-10-CM

## 2012-08-26 NOTE — Progress Notes (Signed)
Patient ID: Gregory Pham, male   DOB: Jan 04, 1941, 72 y.o.   MRN: [id number] This 72 year old male status post laparoscopic cholecystectomy. Patient has been doing well postoperatively without any complaints. He's had a normal diet normal bowel function.  On exam: His wounds are clean dry and intact  Assessment and plan: The patient is to continue with his weight restriction for 2-3 weeks. Is to followup when necessary

## 2013-10-21 IMAGING — RF DG CHOLANGIOGRAM OPERATIVE
1 series · 8 of 8 positions shown · non-contrast
Comparison: None

CLINICAL DATA: Cholelithiasis

INTRAOPERATIVE CHOLANGIOGRAM
TECHNIQUE: Cholangiographic images from the C-arm fluoroscopic
device were submitted for interpretation post-operatively.  Please
see the procedural report for the amount of contrast and the
fluoroscopy time utilized.

[Series 1: run · 2 acquisitions, 8 frames shown]
[im 1/2]
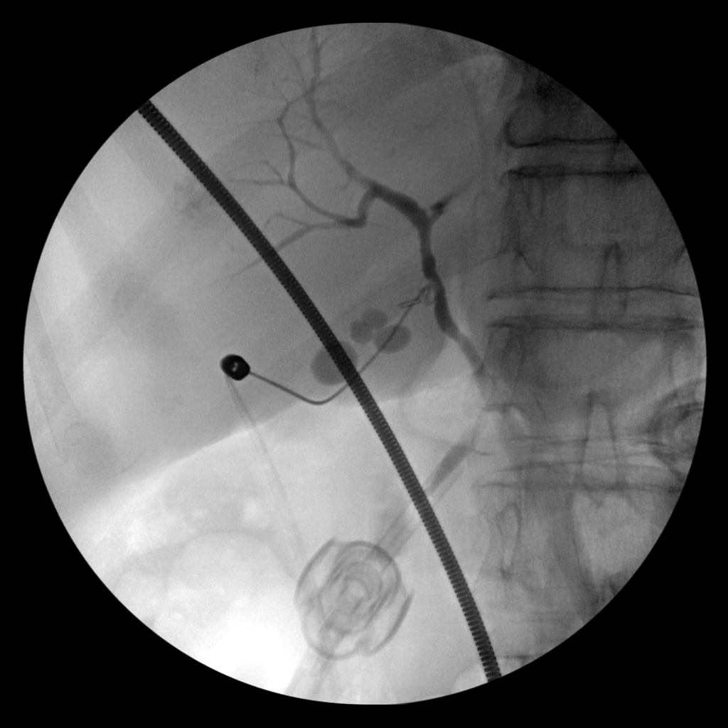
[im 1/2]
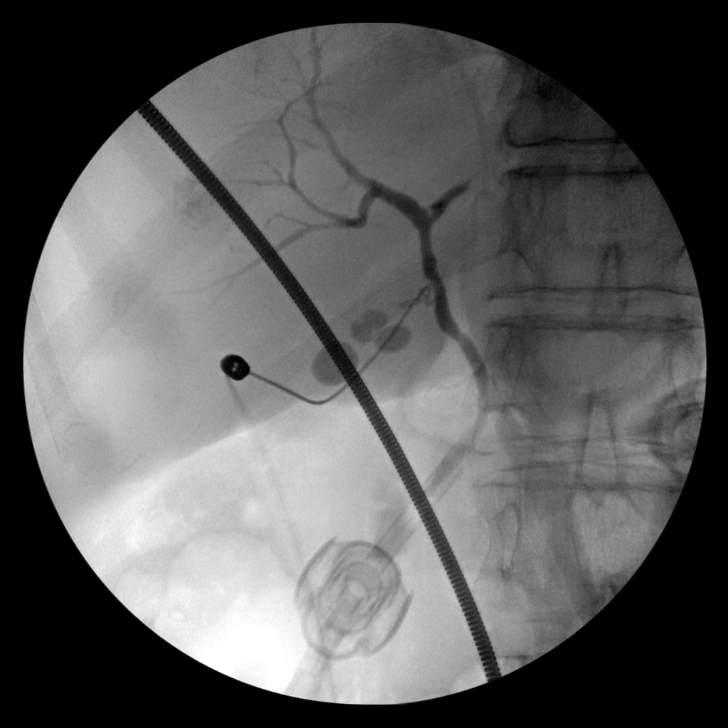
[im 1/2]
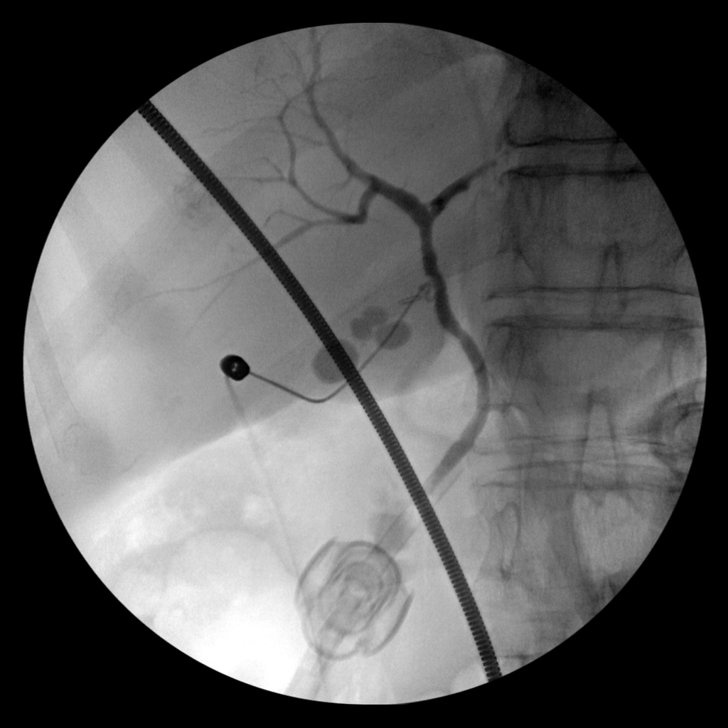
[im 1/2]
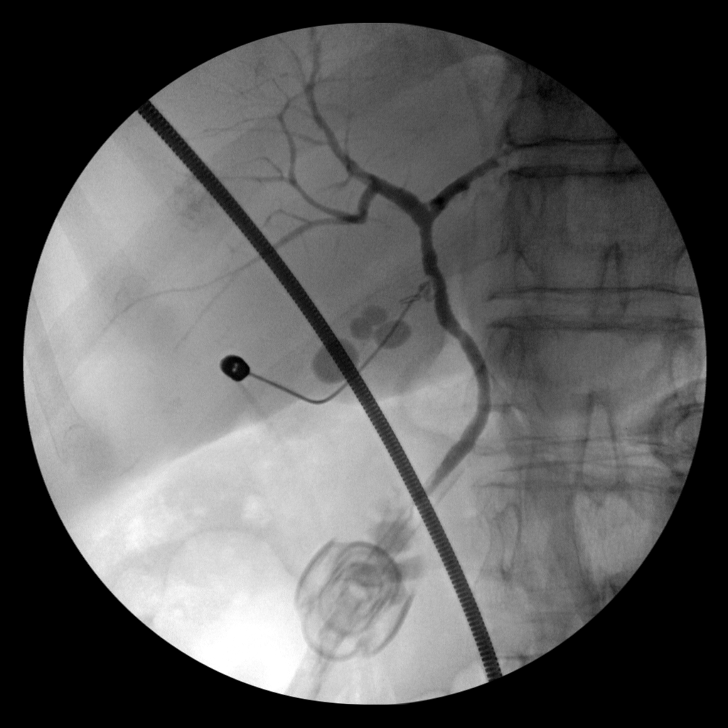
[im 2/2]
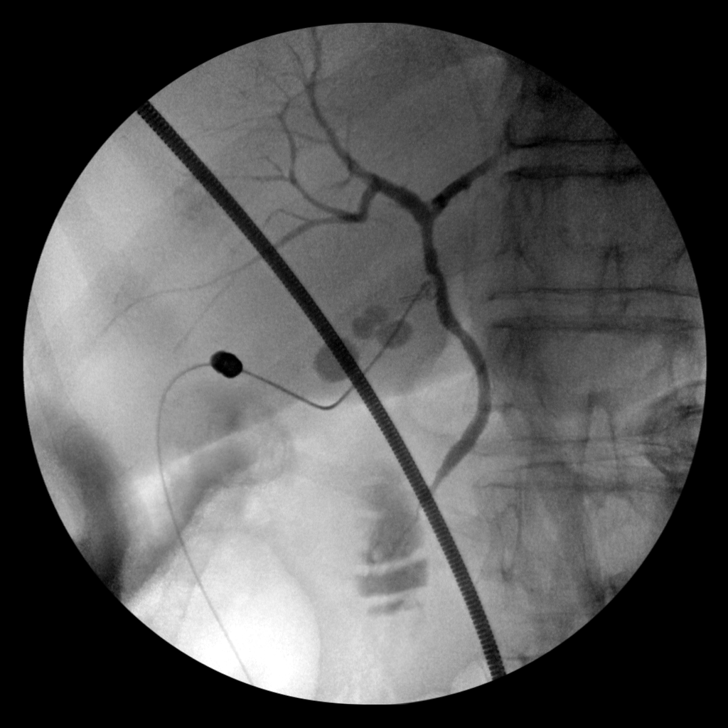
[im 2/2]
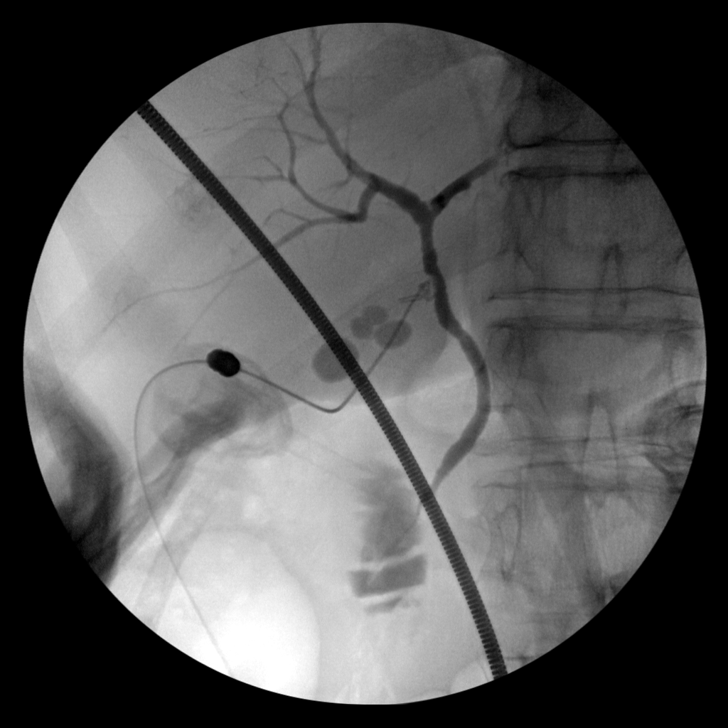
[im 2/2]
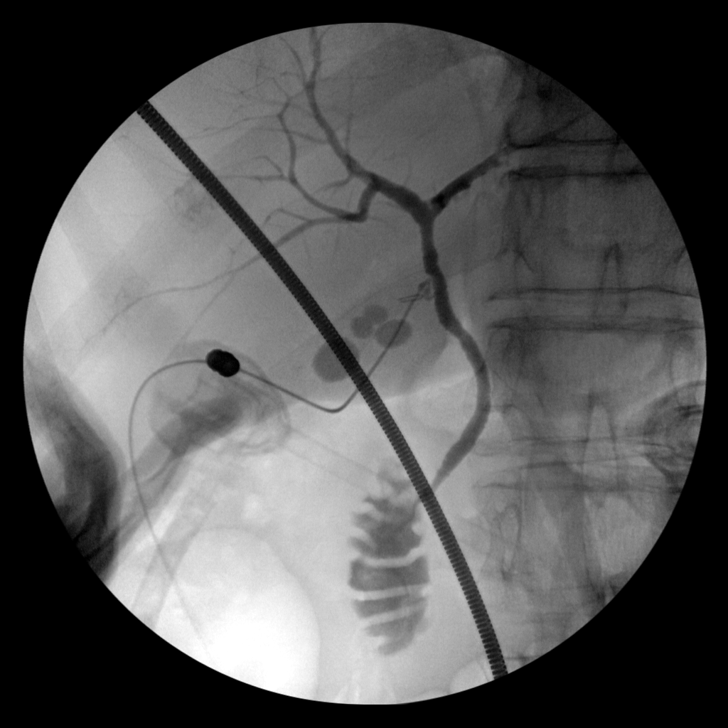
[im 2/2]
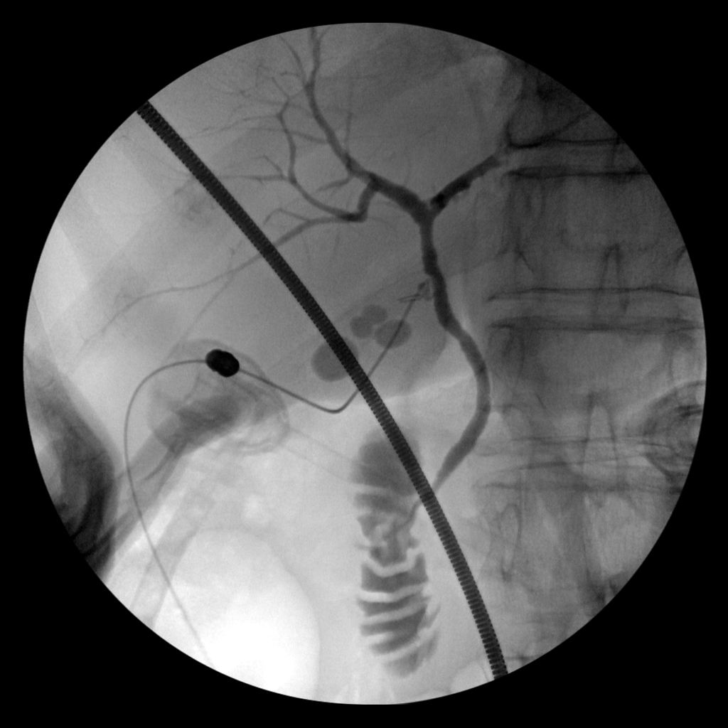

[8 of 8 positions shown; findings below may reference images not displayed]

FINDINGS: No persistent filling defects in the common duct.
Intrahepatic ducts are incompletely visualized, appearing
decompressed centrally. Contrast passes into the duodenum.

IMPRESSION

Negative for retained common duct stone.

## 2013-12-07 ENCOUNTER — Ambulatory Visit (INDEPENDENT_AMBULATORY_CARE_PROVIDER_SITE_OTHER)
Admission: RE | Admit: 2013-12-07 | Discharge: 2013-12-07 | Disposition: A | Discharge status: Home / Self Care | Payer: Medicare Other | Source: Ambulatory Visit | Attending: Physician Assistant | Admitting: Physician Assistant

## 2013-12-07 ENCOUNTER — Telehealth: Payer: Self-pay | Admitting: Cardiology

## 2013-12-07 ENCOUNTER — Other Ambulatory Visit (INDEPENDENT_AMBULATORY_CARE_PROVIDER_SITE_OTHER): Payer: Medicare Other

## 2013-12-07 ENCOUNTER — Ambulatory Visit (INDEPENDENT_AMBULATORY_CARE_PROVIDER_SITE_OTHER): Payer: Medicare Other | Admitting: Physician Assistant

## 2013-12-07 ENCOUNTER — Encounter: Payer: Self-pay | Admitting: Physician Assistant

## 2013-12-07 VITALS — BP 132/74 | HR 72 | Ht 71.0 in | Wt 171.0 lb

## 2013-12-07 DIAGNOSIS — R079 Chest pain, unspecified: Secondary | ICD-10-CM

## 2013-12-07 DIAGNOSIS — Z8673 Personal history of transient ischemic attack (TIA), and cerebral infarction without residual deficits: Secondary | ICD-10-CM | POA: Insufficient documentation

## 2013-12-07 DIAGNOSIS — J449 Chronic obstructive pulmonary disease, unspecified: Secondary | ICD-10-CM | POA: Insufficient documentation

## 2013-12-07 DIAGNOSIS — K219 Gastro-esophageal reflux disease without esophagitis: Secondary | ICD-10-CM | POA: Insufficient documentation

## 2013-12-07 DIAGNOSIS — E785 Hyperlipidemia, unspecified: Secondary | ICD-10-CM | POA: Insufficient documentation

## 2013-12-07 DIAGNOSIS — I10 Essential (primary) hypertension: Secondary | ICD-10-CM | POA: Insufficient documentation

## 2013-12-07 LAB — CBC WITH DIFFERENTIAL/PLATELET
BASOS ABS: 0 10*3/uL (ref 0.0–0.1)
Basophils Relative: 0.4 % (ref 0.0–3.0)
EOS PCT: 1.9 % (ref 0.0–5.0)
Eosinophils Absolute: 0.2 10*3/uL (ref 0.0–0.7)
HEMATOCRIT: 46.5 % (ref 39.0–52.0)
Hemoglobin: 15.8 g/dL (ref 13.0–17.0)
LYMPHS ABS: 1.2 10*3/uL (ref 0.7–4.0)
Lymphocytes Relative: 14.5 % (ref 12.0–46.0)
MCHC: 34 g/dL (ref 30.0–36.0)
MCV: 88.9 fl (ref 78.0–100.0)
MONO ABS: 0.8 10*3/uL (ref 0.1–1.0)
MONOS PCT: 10.2 % (ref 3.0–12.0)
NEUTROS PCT: 73 % (ref 43.0–77.0)
Neutro Abs: 6 10*3/uL (ref 1.4–7.7)
PLATELETS: 167 10*3/uL (ref 150.0–400.0)
RBC: 5.23 Mil/uL (ref 4.22–5.81)
RDW: 13.7 % (ref 11.5–14.6)
WBC: 8.3 10*3/uL (ref 4.5–10.5)

## 2013-12-07 LAB — BASIC METABOLIC PANEL
BUN: 15 mg/dL (ref 6–23)
CO2: 28 mEq/L (ref 19–32)
CREATININE: 0.8 mg/dL (ref 0.4–1.5)
Calcium: 9.2 mg/dL (ref 8.4–10.5)
Chloride: 103 mEq/L (ref 96–112)
GFR: 94.76 mL/min (ref >60.00)
GLUCOSE: 98 mg/dL (ref 70–99)
Potassium: 4.2 mEq/L (ref 3.5–5.1)
Sodium: 139 mEq/L (ref 135–145)

## 2013-12-07 LAB — TROPONIN I: TROPONIN I: 0.01 ng/mL (ref <0.06)

## 2013-12-07 MED ORDER — RABEPRAZOLE SODIUM 20 MG PO TBEC: 20.0000 mg | DELAYED_RELEASE_TABLET | Freq: Two times a day (BID) | ORAL | Status: DC

## 2013-12-07 NOTE — Progress Notes (Signed)
Gregory Pham, Gregory Pham Gregory Pham, Gregory Pham  [id number] Phone: [phone] Fax:  [phone]  Date:  12/07/2013   ID:  Gregory Pham, DOB 03-02-1941, MRN [id number]  PCP:  Gregory Pham  Cardiologist:  Dr. Fransico Pham     History of Present Illness: Gregory Pham is a 72 y.o. male with a history of HTN, HL, GERD, cholelithiasis, PSVT, lumbar DDD, COPD, prior stroke in 2010. Evaluated by Dr. Radford Pham in 03/2013 for chest pain.  He called into the office today with complaints of chest pain. He was added onto my schedule. The patient notes a long history of anxiety and increased stress. He has had increased stress recently at home. Several nights ago, he developed left-sided chest discomfort while lying in bed. He eventually had relief with nitroglycerin x2. He works at a [employer]. He typically does heavy lifting. However, today while at rest, he developed left-sided chest discomfort. He again took 2 nitroglycerin with relief. He does note associated chest pain with meals at times. He denies dysphagia. Belching does help. He denies any associated dyspnea, nausea, diaphoresis, radiating symptoms. His symptoms are described as sharp and spreading out across his chest and feeling somewhat pressure like. He denies any history of exertional chest discomfort, dyspnea with exertion, orthopnea, PND or edema.  Studies:  - Echo (05/03/13):  EF 66.2%, trace MR, mild AI, trace PI, mild aortic root dilatation  - Nuclear (04/14/13):  No ischemia, good exercise capacity, EF 79%  - Event Monitor (04/2013):  No significant arrhythmias.   Recent Labs: No results found for requested labs within last 365 days. 06/06/2013: Creatinine 0.89, potassium 4.5, ALT 33, LDL 94  Wt Readings from Last 3 Encounters:  12/07/13 171 lb (77.565 kg)  08/26/12 170 lb (77.111 kg)  08/04/12 172 lb 12.8 oz (78.382 kg)     Past Medical History  Diagnosis Date  . Adenomatous colon polyp   . Diverticulosis   . Internal  hemorrhoids   . Esophageal stricture   . Hypertension   . Hyperlipidemia   . GERD (gastroesophageal reflux disease)   . Gallstones   . PSVT (paroxysmal supraventricular tachycardia)   . COPD (chronic obstructive pulmonary disease)   . Cataract   . Bell's palsy   . CVA (cerebral infarction)   . Plantar fasciitis   . BPH (benign prostatic hypertrophy)   . Renal cyst, right   . Anxiety   . CVD (cardiovascular disease)   . Vitamin D deficiency   . Asthma   . Osteoarthritis   . External hemorrhoids   . HH (hiatus hernia)     Current Outpatient Prescriptions  Medication Sig Dispense Refill  . albuterol (PROVENTIL HFA;VENTOLIN HFA) 108 (90 BASE) MCG/ACT inhaler Inhale 2 puffs into the lungs every 6 (six) hours as needed. For shortness of breath/wheezing      . atenolol (TENORMIN) 50 MG tablet Take 50 mg by mouth every morning.       Marland Kitchen Bioflavonoid Products (ESTER-C) 500-50 MG TABS Take 1 tablet by mouth every morning.       . clopidogrel (PLAVIX) 75 MG tablet Take 75 mg by mouth every morning.       . Cranberry 500 MG CAPS Take 1,000 mg by mouth 3 (three) times daily after meals.       Marland Kitchen desonide (DESOWEN) 0.05 % cream Apply 1 application topically daily as needed. For red bumps on face      . diclofenac sodium (VOLTAREN) 1 %  GEL Apply 1 application topically 2 (two) times daily as needed. Apply to arthritic joints for pain      . fish oil-omega-3 fatty acids 1000 MG capsule Take 1 g by mouth 3 (three) times daily after meals.      . fluticasone (FLONASE) 50 MCG/ACT nasal spray Place 1 spray into both nostrils at bedtime.       . Fluticasone-Salmeterol (ADVAIR DISKUS) 100-50 MCG/DOSE AEPB Inhale 1 puff into the lungs 2 (two) times daily.       . hydrOXYzine (ATARAX/VISTARIL) 10 MG tablet Take 20 mg by mouth at bedtime. for insomnia.      . Magnesium 500 MG CAPS Take 500 mg by mouth every morning.      . Melatonin 10 MG CAPS Take 10 mg by mouth at bedtime.      . meloxicam (MOBIC) 15 MG  tablet Take 15 mg by mouth every morning.      . Multiple Vitamin (MULTIVITAMIN) tablet Take 1 tablet by mouth every morning.       . nitroGLYCERIN (NITROSTAT) 0.4 MG SL tablet Place 0.4 mg under the tongue every 5 (five) minutes as needed. For chest pain      . oxyCODONE (ROXICODONE) 5 MG immediate release tablet Take 1 tablet (5 mg total) by mouth every 4 (four) hours as needed for pain.  30 tablet  0  . RABEprazole (ACIPHEX) 20 MG tablet Take 20 mg by mouth every morning.       . Saw Palmetto 450 MG CAPS Take 900 mg by mouth 3 (three) times daily after meals.       . simvastatin (ZOCOR) 20 MG tablet Take 20 mg by mouth daily.       No current facility-administered medications for this visit.    Allergies:   Azithromycin; Cephalexin; Clarithromycin; Clindamycin/lincomycin; Darvocet; Flexeril; Flomax; Hydrocodone; Hyoscyamine; Naprosyn; Skelaxin; Tramadol; Tussionex pennkinetic er; Levofloxacin; and Sulfa antibiotics   Social History:  The patient  reports that he has never smoked. He has never used smokeless tobacco. He reports that he does not drink alcohol or use illicit drugs.   Family History:  The patient's family history includes Aneurysm in his mother; Breast cancer in his mother and sister; Colon cancer in his sister; Diabetes in his brother, father, and mother; Heart disease in his father and mother; Pancreatic cancer in his sister; Parkinsonism in his father; Prostate cancer in his brother.   ROS:  Please see the history of present illness.   He has an occasional nonproductive cough.   All other systems reviewed and negative.   PHYSICAL EXAM: VS:  BP 132/74  Pulse 72  Ht 5\' 11"  (1.803 m)  Wt 171 lb (77.565 kg)  BMI 23.86 kg/m2 Well nourished, well developed, in no acute distress HEENT: normal Neck: no JVD Vascular: No carotid bruits, DP/PT 2+ bilaterally Cardiac:  normal S1, S2; RRR; no murmur Lungs:  clear to auscultation bilaterally, no wheezing, rhonchi or rales Abd:  soft, nontender, no hepatomegaly Ext: no edema Skin: warm and dry Neuro:  CNs 2-12 intact, no focal abnormalities noted  EKG:  NSR, HR 77, LAD, no ST changes, no change from prior tracing     ASSESSMENT AND PLAN:  1. Chest Pain: Symptoms are somewhat atypical with some typical features. He did have relief with nitroglycerin. He had a low risk Myoview less than one year ago. He exercised for 11 minutes without chest symptoms. He has a lot of GI related symptoms as well.  I will obtain a troponin, basic metabolic panel and CBC. I will also obtain a chest x-ray. If his troponin is abnormal, he will be sent to the emergency room tonight for further evaluation to possibly include cardiac catheterization. If his troponin is normal, he will be set up for an exercise treadmill test within the next week. I have asked Pham to increase his AcipHex to 20 mg twice a day. Consider referral to gastroenterology if his cardiac workup is unremarkable. 2. HTN (hypertension): Controlled. 3. HLD (hyperlipidemia): Continue statin. 4. GERD (gastroesophageal reflux disease): Increase PPI as noted. Consider referral to gastroenterology. 5. COPD (chronic obstructive pulmonary disease): Continue current regimen. 6. History of CVA (cerebrovascular accident): Continue Plavix, statin. 7. Disposition: Follow up with me or Dr. Radford Pham in 2 weeks  Signed, Richardson Dopp, PA-C  12/07/2013 3:41 PM

## 2013-12-07 NOTE — Telephone Encounter (Signed)
Spoke with pt who reports chest pain relieved with 2 NTG on Sunday. Describes as sharp,stabbing pain. He was fine on Monday and Tuesday. This AM had episode of pain relieved with 2 NTG. He is not having pain at this time. Reports he is not sure if it is his heart or his lungs as he has had lung problems in past. He reports Dr. Radford Pax told him to come to office when this happens.Marland Kitchen He reports he is leaving home shortly to come to office. He does not want to go to ED. I have instructed pt he should not come to office and I will discuss with Dr. Radford Pax and call him back with her recommendations.  He is aware if pain returns he should call 911.

## 2013-12-07 NOTE — Telephone Encounter (Signed)
Patient called in with chest severe chest pain. He had chest pain yesterday and took two nitros and pain went away. He has taken 2 nitros today. Sent patients call to [facility] [place] in triage.

## 2013-12-07 NOTE — Telephone Encounter (Signed)
Reviewed with Dr. Radford Pax and Burt Knack and appt made for pt to see Richardson Dopp, PA today.  I spoke with pt and he would like to come in today for office visit with Richardson Dopp, PA at 3:40. He is aware if chest pain returns he should go to ED.

## 2013-12-07 NOTE — Patient Instructions (Signed)
Your physician recommends that you return for lab work in: [place], [place] TO THE BASEMENT  BMET, CBC W/DIFF, AND A STAT TROPONIN I ; PER SCOTT WEAVER, PAC NEED TO STAT TROPONIN I CALLED INTO THE OFFICE BEFORE 6 PM TODAY AND IF AFTER 6 PM THEN NEEDS TO BE CALLED INTO THE ON-CALL DOC.   Your physician has requested that you have an exercise tolerance test; PER SCOTT WEAVER, PAC NEEDS TO BE DONE WITH IN 1 WEEK EITHER IN OUR OFFICE OR [place] OFFICE. For further information please visit [email or web address]. Please also follow instruction sheet, as given.  PLEASE FOLLOW UP WITH SCOTT WEAVER, PAC IN 2 WEEKS SAME DAY DR. Radford Pax IS IN THE OFFICE

## 2013-12-08 ENCOUNTER — Encounter: Payer: Self-pay | Admitting: General Surgery

## 2013-12-08 ENCOUNTER — Other Ambulatory Visit: Payer: Self-pay | Admitting: *Deleted

## 2013-12-08 ENCOUNTER — Telehealth (HOSPITAL_COMMUNITY): Payer: Self-pay

## 2013-12-08 DIAGNOSIS — R079 Chest pain, unspecified: Secondary | ICD-10-CM

## 2013-12-08 MED ORDER — RABEPRAZOLE SODIUM 20 MG PO TBEC
20.0000 mg | DELAYED_RELEASE_TABLET | Freq: Two times a day (BID) | ORAL | Status: DC
Start: 2013-12-08 — End: 2014-07-16

## 2013-12-13 ENCOUNTER — Ambulatory Visit (HOSPITAL_COMMUNITY)
Admission: RE | Admit: 2013-12-13 | Discharge: 2013-12-13 | Disposition: A | Discharge status: Home / Self Care | Payer: Medicare Other | Source: Ambulatory Visit | Attending: Cardiovascular Disease | Admitting: Cardiovascular Disease

## 2013-12-13 DIAGNOSIS — R079 Chest pain, unspecified: Secondary | ICD-10-CM

## 2013-12-13 DIAGNOSIS — Z9289 Personal history of other medical treatment: Secondary | ICD-10-CM

## 2013-12-13 HISTORY — DX: Personal history of other medical treatment: Z92.89

## 2013-12-15 ENCOUNTER — Telehealth: Payer: Self-pay | Admitting: Cardiology

## 2013-12-15 NOTE — Telephone Encounter (Signed)
Please find out if his CP has improved

## 2013-12-15 NOTE — Telephone Encounter (Signed)
Please let patient know that stress test was normal

## 2013-12-16 NOTE — Telephone Encounter (Signed)
LVM on pts cell phone. Called his Home Phone as well. Was able to reach pt. He has not had any CP and he is aware of the results. He wants Dr Radford Pax to be aware on Thursday he has a roast beef sandwich from [employer] and some jalapeno bites. On Friday he got the same meal. On Sunday night is when he had the CP and at work on Monday. He used two nito SL tablets. He knows that the meals could be involved. He will watch his diet closer. To Dr Radford Pax to make aware.

## 2013-12-22 ENCOUNTER — Ambulatory Visit (INDEPENDENT_AMBULATORY_CARE_PROVIDER_SITE_OTHER): Payer: Medicare Other | Admitting: Cardiology

## 2013-12-22 ENCOUNTER — Encounter: Payer: Self-pay | Admitting: Cardiology

## 2013-12-22 VITALS — BP 130/71 | HR 56 | Ht 71.0 in | Wt 178.0 lb

## 2013-12-22 DIAGNOSIS — I471 Supraventricular tachycardia, unspecified: Secondary | ICD-10-CM

## 2013-12-22 DIAGNOSIS — I10 Essential (primary) hypertension: Secondary | ICD-10-CM

## 2013-12-22 DIAGNOSIS — I77810 Thoracic aortic ectasia: Secondary | ICD-10-CM

## 2013-12-22 NOTE — Progress Notes (Signed)
[address] [place], [place]  [address] Phone: [phone] Fax:  [phone]  Date:  12/22/2013   ID:  Gregory Pham, DOB 1941-01-20, MRN [id number]  PCP:  Namon Cirri  Cardiologist:  Fransico Him, MD     History of Present Illness: Gregory Pham is a 73 y.o. male with a histor yof PSVT who saw me in August 2014 with complaints of atypical CP.  2D echo showed normal LVF with mild AI, trivial MR/TR//PR and mildly dilated aortic root.  A nuclear stress test showed no ischemia and normal LVF.    He has not had any further chest pain since I saw him last.  He denie any SOB, DOE, dizziness, palpitations or syncope.   Wt Readings from Last 3 Encounters:  12/07/13 171 lb (77.565 kg)  08/26/12 170 lb (77.111 kg)  08/04/12 172 lb 12.8 oz (78.382 kg)     Past Medical History  Diagnosis Date  . Adenomatous colon polyp   . Diverticulosis   . Internal hemorrhoids   . Esophageal stricture   . Hypertension   . Hyperlipidemia   . GERD (gastroesophageal reflux disease)   . Gallstones   . PSVT (paroxysmal supraventricular tachycardia)   . COPD (chronic obstructive pulmonary disease)   . Cataract   . Bell's palsy   . CVA (cerebral infarction)   . Plantar fasciitis   . Renal cyst, right   . Anxiety   . CVD (cardiovascular disease)   . Vitamin D deficiency   . Asthma   . Osteoarthritis   . External hemorrhoids   . HH (hiatus hernia)   . Disc herniation     L4-L5, S/P epiduiral steroids  . Colon polyp   . Renal cyst, right     Uro Dr [name]  . BPH (benign prostatic hypertrophy)     reason for microscopic hematuria Dr [name]  . Chest pain     Current Outpatient Prescriptions  Medication Sig Dispense Refill  . albuterol (PROVENTIL HFA;VENTOLIN HFA) 108 (90 BASE) MCG/ACT inhaler Inhale 2 puffs into the lungs every 6 (six) hours as needed. For shortness of breath/wheezing      . atenolol (TENORMIN) 50 MG tablet Take 50 mg by mouth every morning.       Marland Kitchen  Bioflavonoid Products (ESTER-C) 500-50 MG TABS Take 1 tablet by mouth every morning.       . clopidogrel (PLAVIX) 75 MG tablet Take 75 mg by mouth every morning.       . Cranberry 500 MG CAPS Take 1,000 mg by mouth 3 (three) times daily after meals.       Marland Kitchen desonide (DESOWEN) 0.05 % cream Apply 1 application topically daily as needed. For red bumps on face      . diclofenac sodium (VOLTAREN) 1 % GEL Apply 1 application topically 2 (two) times daily as needed. Apply to arthritic joints for pain      . fish oil-omega-3 fatty acids 1000 MG capsule Take 1 g by mouth 3 (three) times daily after meals.      . fluticasone (FLONASE) 50 MCG/ACT nasal spray Place 1 spray into both nostrils at bedtime.       . Fluticasone-Salmeterol (ADVAIR DISKUS) 100-50 MCG/DOSE AEPB Inhale 1 puff into the lungs 2 (two) times daily.       . hydrOXYzine (ATARAX/VISTARIL) 10 MG tablet Take 20 mg by mouth at bedtime. for insomnia.      . Magnesium 500 MG CAPS Take  500 mg by mouth every morning.      . Melatonin 10 MG CAPS Take 10 mg by mouth at bedtime.      . meloxicam (MOBIC) 15 MG tablet Take 15 mg by mouth every morning.      . Multiple Vitamin (MULTIVITAMIN) tablet Take 1 tablet by mouth every morning.       . nitroGLYCERIN (NITROSTAT) 0.4 MG SL tablet Place 0.4 mg under the tongue every 5 (five) minutes as needed. For chest pain      . oxyCODONE (ROXICODONE) 5 MG immediate release tablet Take 1 tablet (5 mg total) by mouth every 4 (four) hours as needed for pain.  30 tablet  0  . RABEprazole (ACIPHEX) 20 MG tablet Take 1 tablet (20 mg total) by mouth 2 (two) times daily.  60 tablet  6  . Saw Palmetto 450 MG CAPS Take 900 mg by mouth 3 (three) times daily after meals.       . simvastatin (ZOCOR) 20 MG tablet Take 20 mg by mouth daily.       No current facility-administered medications for this visit.    Allergies:    Allergies  Allergen Reactions  . Azithromycin     unknown  . Cephalexin     unknown  .  Clarithromycin     unknown  . Clindamycin/Lincomycin     unknown  . Darvocet [Propoxyphene N-Acetaminophen]     insomnia  . Flexeril [Cyclobenzaprine Hcl]     insomnia  . Flomax [Tamsulosin Hcl]     unknown  . Hydrocodone     unknown  . Hyoscyamine     unknown  . Naprosyn [Naproxen]     unknown  . Skelaxin     Irritability, insomnia, palpitations  . Tramadol     insomnia  . Tussionex Pennkinetic Er [Hydrocod Polst-Cpm Polst Er]     unknown  . Levofloxacin Rash  . Sulfa Antibiotics Rash    Social History:  The patient  reports that he has never smoked. He has never used smokeless tobacco. He reports that he does not drink alcohol or use illicit drugs.   Family History:  The patient's family history includes Aneurysm in his mother; Breast cancer in his mother and sister; CVA in his mother; Colon cancer in his sister; Diabetes in his brother, father, and mother; Heart attack in his father; Heart disease in his father and mother; Pancreatic cancer in his sister; Parkinsonism in his father; Prostate cancer in his brother; Stroke in his mother.   ROS:  Please see the history of present illness.      All other systems reviewed and negative.   PHYSICAL EXAM: VS:  There were no vitals taken for this visit. Well nourished, well developed, in no acute distress HEENT: normal Neck: no JVD Cardiac:  normal S1, S2; RRR; no murmur Lungs:  clear to auscultation bilaterally, no wheezing, rhonchi or rales Abd: soft, nontender, no hepatomegaly Ext: no edema Skin: warm and dry Neuro:  CNs 2-12 intact, no focal abnormalities noted  ASSESSMENT AND PLAN:  1. Atypical CP that was most likely due to eating spicy food the day before.  Nuclear stress test was normal.  He has not had any further CP.   2. PSVT with no reoccurence - continue Atenolol 3. Mildly dilated aortic root - recheck 2D echo in 6 months to followup on dilated aorta 4.  HTN well controlled - continue Atenolol  Followup  with me in 1 year  Signed, Fransico Him, MD 12/22/2013 8:58 AM

## 2013-12-22 NOTE — Patient Instructions (Signed)
Your physician recommends that you continue on your current medications as directed. Please refer to the Current Medication list given to you today.  Your physician has requested that you have an echocardiogram. Echocardiography is a painless test that uses sound waves to create images of your heart. It provides your doctor with information about the size and shape of your heart and how well your heart's chambers and valves are working. This procedure takes approximately one hour. There are no restrictions for this procedure. ( recall already in for you. You will get a letter in the mail when it is time to schedule this echo.)  Your physician wants you to follow-up in: 1 year with Dr Mallie Snooks will receive a reminder letter in the mail two months in advance. If you don't receive a letter, please call our office to schedule the follow-up appointment.

## 2014-02-16 ENCOUNTER — Other Ambulatory Visit: Payer: Self-pay | Admitting: Family Medicine

## 2014-02-16 ENCOUNTER — Ambulatory Visit
Admission: RE | Admit: 2014-02-16 | Discharge: 2014-02-16 | Disposition: A | Discharge status: Home / Self Care | Payer: Medicare Other | Source: Ambulatory Visit | Attending: Family Medicine | Admitting: Family Medicine

## 2014-02-16 DIAGNOSIS — M542 Cervicalgia: Secondary | ICD-10-CM

## 2014-02-23 NOTE — Telephone Encounter (Signed)
Encounter complete. 

## 2014-06-20 ENCOUNTER — Other Ambulatory Visit (HOSPITAL_COMMUNITY): Payer: Medicare Other

## 2014-07-16 ENCOUNTER — Other Ambulatory Visit: Payer: Self-pay | Admitting: Cardiology

## 2014-07-19 ENCOUNTER — Ambulatory Visit (HOSPITAL_COMMUNITY): Payer: Medicare Other | Attending: Cardiology | Admitting: Radiology

## 2014-07-19 DIAGNOSIS — I77810 Thoracic aortic ectasia: Secondary | ICD-10-CM | POA: Diagnosis present

## 2014-07-19 DIAGNOSIS — E785 Hyperlipidemia, unspecified: Secondary | ICD-10-CM | POA: Diagnosis not present

## 2014-07-19 DIAGNOSIS — I10 Essential (primary) hypertension: Secondary | ICD-10-CM | POA: Diagnosis not present

## 2014-07-19 NOTE — Progress Notes (Signed)
Echocardiogram performed.  

## 2014-07-20 ENCOUNTER — Other Ambulatory Visit (HOSPITAL_COMMUNITY): Payer: Self-pay | Admitting: Cardiology

## 2014-07-20 ENCOUNTER — Other Ambulatory Visit (HOSPITAL_COMMUNITY): Payer: Self-pay | Admitting: Radiology

## 2014-07-20 DIAGNOSIS — I77810 Thoracic aortic ectasia: Secondary | ICD-10-CM

## 2014-09-29 ENCOUNTER — Encounter: Payer: Self-pay | Admitting: Cardiology

## 2014-12-28 NOTE — Progress Notes (Signed)
Cardiology Office Note   Date:  12/29/2014   ID:  Gregory Pham, DOB [birth year], MRN [id number]  PCP:  Vena Austria, MD    Chief Complaint  Patient presents with  . Irregular Heart Beat  . Follow-up    hypertension      History of Present Illness: Gregory Pham is a 74 y.o. male with a histor yof PSVT, mild AI and mildly dilated aortic root. He presents today for followup. He denies any SOB, chest pain, DOE, dizziness, palpitations, LE edema or syncope.    Past Medical History  Diagnosis Date  . Adenomatous colon polyp   . Diverticulosis   . Internal hemorrhoids   . Esophageal stricture   . Hypertension   . Hyperlipidemia   . GERD (gastroesophageal reflux disease)   . Gallstones   . PSVT (paroxysmal supraventricular tachycardia)   . COPD (chronic obstructive pulmonary disease)   . Cataract   . Bell's palsy   . CVA (cerebral infarction)   . Plantar fasciitis   . Renal cyst, right   . Anxiety   . CVD (cardiovascular disease)   . Vitamin D deficiency   . Asthma   . Osteoarthritis   . External hemorrhoids   . HH (hiatus hernia)   . Disc herniation     L4-L5, S/P epiduiral steroids  . Colon polyp   . Renal cyst, right     Uro Dr [name]  . BPH (benign prostatic hypertrophy)     reason for microscopic hematuria Dr [name]    Past Surgical History  Procedure Laterality Date  . Appendectomy    . Nasal sinus surgery    . Inguinal hernia repair      bilateral  . Colonoscopy    . Upper gastrointestinal endoscopy    . Cholecystectomy  08/12/2012    Procedure: LAPAROSCOPIC CHOLECYSTECTOMY WITH INTRAOPERATIVE CHOLANGIOGRAM;  Surgeon: Ralene Ok, MD;  Location: [facility];  Service: General;  Laterality: N/A;     Current Outpatient Prescriptions  Medication Sig Dispense Refill  . atenolol (TENORMIN) 50 MG tablet Take 50 mg by mouth every morning.     . clopidogrel (PLAVIX) 75 MG tablet Take 75 mg by mouth every morning.     . Cranberry 500  MG CAPS Take 1,000 mg by mouth 3 (three) times daily after meals.     Marland Kitchen desonide (DESOWEN) 0.05 % cream Apply 1 application topically daily as needed. For red bumps on face    . diclofenac sodium (VOLTAREN) 1 % GEL Apply 1 application topically 2 (two) times daily as needed. Apply to arthritic joints for pain    . fish oil-omega-3 fatty acids 1000 MG capsule Take 1 g by mouth 3 (three) times daily after meals.    . fluticasone (FLONASE) 50 MCG/ACT nasal spray Place 1 spray into both nostrils at bedtime.     . Fluticasone-Salmeterol (ADVAIR DISKUS) 100-50 MCG/DOSE AEPB Inhale 1 puff into the lungs 2 (two) times daily.     . hydrOXYzine (ATARAX/VISTARIL) 10 MG tablet Take 20 mg by mouth at bedtime. for insomnia.    . Magnesium 500 MG CAPS Take 500 mg by mouth every morning.    . Melatonin 10 MG CAPS Take 10 mg by mouth at bedtime.    . meloxicam (MOBIC) 15 MG tablet Take 15 mg by mouth every morning.    . Multiple Vitamin (MULTIVITAMIN) tablet Take 1 tablet by mouth every morning.     . nitroGLYCERIN (NITROSTAT) 0.4 MG  SL tablet Place 0.4 mg under the tongue every 5 (five) minutes as needed. For chest pain    . RABEprazole (ACIPHEX) 20 MG tablet TAKE ONE TABLET BY MOUTH TWICE DAILY 60 tablet 5  . Saw Palmetto 450 MG CAPS Take 900 mg by mouth 3 (three) times daily after meals.     . simvastatin (ZOCOR) 40 MG tablet Take 40 mg by mouth daily. Pt takes 1/2 tablet by mouth daily  5   No current facility-administered medications for this visit.    Allergies:   Azithromycin; Cephalexin; Clarithromycin; Clindamycin/lincomycin; Darvocet; Flexeril; Flomax; Hydrocodone; Hyoscyamine; Naprosyn; Oxycodone; Skelaxin; Tramadol; Tussionex pennkinetic er; Levofloxacin; and Sulfa antibiotics    Social History:  The patient  reports that he has never smoked. He has never used smokeless tobacco. He reports that he does not drink alcohol or use illicit drugs.   Family History:  The patient's family history includes  Aneurysm in his mother; Breast cancer in his mother and sister; CVA in his mother; Colon cancer in his sister; Diabetes in his brother, father, and mother; Heart attack in his father; Heart disease in his father and mother; Pancreatic cancer in his sister; Parkinsonism in his father; Prostate cancer in his brother; Stroke in his mother.    ROS:  Please see the history of present illness.   Otherwise, review of systems are positive for arthritic pain and muscle pain.   All other systems are reviewed and negative.    PHYSICAL EXAM: VS:  BP 132/74 mmHg  Pulse 60  Ht 5\' 11"  (1.803 m)  Wt 174 lb 12.8 oz (79.289 kg)  BMI 24.39 kg/m2 , BMI Body mass index is 24.39 kg/(m^2). GEN: Well nourished, well developed, in no acute distress HEENT: normal Neck: no JVD, carotid bruits, or masses Cardiac: RRR; no murmurs, rubs, or gallops,no edema  Respiratory:  clear to auscultation bilaterally, normal work of breathing GI: soft, nontender, nondistended, + BS MS: no deformity or atrophy Skin: warm and dry, no rash Neuro:  Strength and sensation are intact Psych: euthymic mood, full affect   EKG:  EKG was ordered today and showed NSR at 60bpm with no ST changes and LAD    Recent Labs: No results found for requested labs within last 365 days.    Lipid Panel    Component Value Date/Time   CHOL  10/21/2010 0600    153        ATP III CLASSIFICATION:  <200     mg/dL   Desirable  200-239  mg/dL   Borderline High  >=240    mg/dL   High          TRIG 288* 10/21/2010 0600   HDL 31* 10/21/2010 0600   CHOLHDL 4.9 10/21/2010 0600   VLDL 58* 10/21/2010 0600   LDLCALC  10/21/2010 0600    64        Total Cholesterol/HDL:CHD Risk Coronary Heart Disease Risk Table                     Men   Women  1/2 Average Risk   3.4   3.3  Average Risk       5.0   4.4  2 X Average Risk   9.6   7.1  3 X Average Risk  23.4   11.0        Use the calculated Patient Ratio above and the CHD Risk Table to determine  the patient's CHD Risk.  ATP III CLASSIFICATION (LDL):  <100     mg/dL   Optimal  100-129  mg/dL   Near or Above                    Optimal  130-159  mg/dL   Borderline  160-189  mg/dL   High  >190     mg/dL   Very High      Wt Readings from Last 3 Encounters:  12/29/14 174 lb 12.8 oz (79.289 kg)  12/22/13 178 lb (80.74 kg)  12/07/13 171 lb (77.565 kg)    ASSESSMENT AND PLAN: 1.  PSVT with no reoccurence - continue Atenolol 2.  Mildly dilated aortic root - last echo 07/2014 with dimensions 3.6cm - recheck 2D echo 07/2015 3.  HTN well controlled - continue Atenolol 4.   Mild AI     Current medicines are reviewed at length with the patient today.  The patient does not have concerns regarding medicines.  The following changes have been made:  no change  Labs/ tests ordered today include: see above assessment and plan No orders of the defined types were placed in this encounter.     Disposition:   FU with me in 1 year   Signed, Sueanne Margarita, MD  12/29/2014 3:45 PM    [facility] [address], [place], [place]  [address] Phone: [phone]; Fax: [phone]

## 2014-12-29 ENCOUNTER — Encounter: Payer: Self-pay | Admitting: Cardiology

## 2014-12-29 ENCOUNTER — Ambulatory Visit (INDEPENDENT_AMBULATORY_CARE_PROVIDER_SITE_OTHER): Payer: Medicare Other | Admitting: Cardiology

## 2014-12-29 VITALS — BP 132/74 | HR 60 | Ht 71.0 in | Wt 174.8 lb

## 2014-12-29 DIAGNOSIS — I471 Supraventricular tachycardia: Secondary | ICD-10-CM | POA: Diagnosis not present

## 2014-12-29 DIAGNOSIS — I77810 Thoracic aortic ectasia: Secondary | ICD-10-CM

## 2014-12-29 DIAGNOSIS — I10 Essential (primary) hypertension: Secondary | ICD-10-CM | POA: Diagnosis not present

## 2014-12-29 NOTE — Patient Instructions (Addendum)
Medication Instructions:  Your physician recommends that you continue on your current medications as directed. Please refer to the Current Medication list given to you today.   Labwork: None  Testing/Procedures: Your physician has requested that you have an echocardiogram in December, 2016. Echocardiography is a painless test that uses sound waves to create images of your heart. It provides your doctor with information about the size and shape of your heart and how well your heart's chambers and valves are working. This procedure takes approximately one hour. There are no restrictions for this procedure.  Follow-Up: Your physician wants you to follow-up in: 1 year with Dr. Radford Pax. You will receive a reminder letter in the mail two months in advance. If you don't receive a letter, please call our office to schedule the follow-up appointment.   Any Other Special Instructions Will Be Listed Below (If Applicable).

## 2015-01-15 ENCOUNTER — Other Ambulatory Visit: Payer: Self-pay | Admitting: Cardiology

## 2015-07-24 ENCOUNTER — Telehealth: Payer: Self-pay | Admitting: Cardiology

## 2015-07-24 DIAGNOSIS — R0789 Other chest pain: Secondary | ICD-10-CM

## 2015-07-24 NOTE — Telephone Encounter (Signed)
Patient st on 11/19, he had a "strange feeling" over his heart that lasted about 1 minute and was the size of his fingertip.  11/21, he spent a lot of time doing yard work and helping his wife cook. After these activities, he had another "strange feeling," but bigger (like a golf ball) and under his L ribcage under the breast bone. He stood still and relaxed and the feeling went away after a minute or so.  He emphasizes that neither instance were painful, but rather felt like something was inside his chest. Since 11/21, he has not had the same feeling again, but did have one instance when the right side of his ribcage was "tingly and crampy."  Patient saw his PCP last week and was cleared, but was told to call cardiology if symptoms arise. The patient st he is exercising regularly, taking medications as directed, and his BP is "great."  He is currently asymptomatic.   To Dr. Radford Pax for recommendations.

## 2015-07-24 NOTE — Telephone Encounter (Signed)
Please order stress myoview to rule out ischemia

## 2015-07-24 NOTE — Telephone Encounter (Signed)
New Message    Pt calling stating on 07/07/15 he had a little pain over his heart that didn't last long maybe a minute and about the size of his finger tip. Pt states that 2 days later on 07/09/15 he had a similar pain but more so a pressure over his heart for more than a minute near his breast bone that was about the size of a golf ball and felt a little less pressure to the left under his rib cage. Pt states that he doesn't think this is something to be overlooked and wants Dr. Radford Pax to be notified. Please call back and advise.

## 2015-07-24 NOTE — Telephone Encounter (Signed)
Stress myoview ordered for scheduling. Reviewed instructions for test with patient and he has no questions.

## 2015-08-02 ENCOUNTER — Other Ambulatory Visit (HOSPITAL_COMMUNITY): Payer: Medicare Other

## 2015-08-07 ENCOUNTER — Telehealth (HOSPITAL_COMMUNITY): Payer: Self-pay | Admitting: *Deleted

## 2015-08-07 NOTE — Telephone Encounter (Signed)
Patient called to see if he wanted to come for nuclear appointment at [facility] instead of 0930. Patient agreed.Patient given detailed instructions per Myocardial Perfusion Study Information Sheet for the test on 08/10/15 at 0715. Patient notified to arrive 15 minutes early and that it is imperative to arrive on time for appointment to keep from having the test rescheduled.  If you need to cancel or reschedule your appointment, please call the office within 24 hours of your appointment. Failure to do so may result in a cancellation of your appointment, and a $50 no show fee. Patient verbalized understanding.Gregory Pham, Ranae Palms

## 2015-08-10 ENCOUNTER — Ambulatory Visit (HOSPITAL_BASED_OUTPATIENT_CLINIC_OR_DEPARTMENT_OTHER): Payer: Medicare Other

## 2015-08-10 ENCOUNTER — Other Ambulatory Visit: Payer: Self-pay

## 2015-08-10 ENCOUNTER — Ambulatory Visit (HOSPITAL_COMMUNITY): Payer: Medicare Other | Attending: Cardiology

## 2015-08-10 ENCOUNTER — Encounter (HOSPITAL_COMMUNITY): Payer: Medicare Other

## 2015-08-10 DIAGNOSIS — I77810 Thoracic aortic ectasia: Secondary | ICD-10-CM | POA: Diagnosis present

## 2015-08-10 DIAGNOSIS — I071 Rheumatic tricuspid insufficiency: Secondary | ICD-10-CM | POA: Diagnosis not present

## 2015-08-10 DIAGNOSIS — I351 Nonrheumatic aortic (valve) insufficiency: Secondary | ICD-10-CM | POA: Insufficient documentation

## 2015-08-10 DIAGNOSIS — I5189 Other ill-defined heart diseases: Secondary | ICD-10-CM | POA: Insufficient documentation

## 2015-08-10 DIAGNOSIS — I10 Essential (primary) hypertension: Secondary | ICD-10-CM

## 2015-08-10 DIAGNOSIS — R0789 Other chest pain: Secondary | ICD-10-CM

## 2015-08-10 LAB — MYOCARDIAL PERFUSION IMAGING
CSEPED: 9 min
CSEPEW: 10.1 METS
CSEPPHR: 130 {beats}/min
Exercise duration (sec): 30 s
LV dias vol: 80 mL
LVSYSVOL: 21 mL
MPHR: 144 {beats}/min
Percent HR: 90 %
RATE: 0.29
Rest HR: 59 {beats}/min
SDS: 1
SRS: 3
SSS: 4
TID: 0.85

## 2015-08-10 MED ORDER — TECHNETIUM TC 99M SESTAMIBI GENERIC - CARDIOLITE
30.9000 | Freq: Once | INTRAVENOUS | Status: AC | PRN
  Administered 2015-08-10: 30.9 via INTRAVENOUS

## 2015-08-10 MED ORDER — TECHNETIUM TC 99M SESTAMIBI GENERIC - CARDIOLITE
10.4000 | Freq: Once | INTRAVENOUS | Status: AC | PRN
  Administered 2015-08-10: 10 via INTRAVENOUS

## 2016-02-13 ENCOUNTER — Other Ambulatory Visit: Payer: Self-pay | Admitting: Cardiology

## 2016-03-04 ENCOUNTER — Encounter: Payer: Self-pay | Admitting: Internal Medicine

## 2016-03-04 ENCOUNTER — Ambulatory Visit (INDEPENDENT_AMBULATORY_CARE_PROVIDER_SITE_OTHER): Payer: Medicare Other | Admitting: Internal Medicine

## 2016-03-04 VITALS — BP 128/76 | HR 72 | Ht 69.25 in | Wt 176.4 lb

## 2016-03-04 DIAGNOSIS — K648 Other hemorrhoids: Secondary | ICD-10-CM

## 2016-03-04 DIAGNOSIS — K6289 Other specified diseases of anus and rectum: Secondary | ICD-10-CM | POA: Diagnosis not present

## 2016-03-04 DIAGNOSIS — K641 Second degree hemorrhoids: Secondary | ICD-10-CM

## 2016-03-04 NOTE — Progress Notes (Signed)
Subjective:    Patient ID: Gregory Pham, male    DOB: June 02, 1941, 75 y.o.   MRN: [id number]  CC: rectal pain  HPI  75 y.o. Male with a pmh of GERD, diverticulosis, internal and external hemorrhoids, anxiety, and a colon polyp on colonoscopy in 2013, presenting to the  office with rectal pain for a few weeks. Describes the pain as burning to aching in the morning, but resolves when he uses preparation H. Had one episode of constipation described as "will start to go and something will stop it at the rectum" but resolves with preparation H. Does not associate any pain during bowel movements. Denies, fever, chills, weight loss, loss in appetite, n/v/d, bloating, hematochezia, melena, bulging sensation in rectum, or pruritus.   Allergies  Allergen Reactions  . Azithromycin     unknown  . Cephalexin     unknown  . Clarithromycin     unknown  . Clindamycin/Lincomycin     unknown  . Darvocet [Propoxyphene N-Acetaminophen]     insomnia  . Flexeril [Cyclobenzaprine Hcl]     insomnia  . Flomax [Tamsulosin Hcl]     unknown  . Hydrocodone     unknown  . Hyoscyamine     unknown  . Naprosyn [Naproxen]     unknown  . Oxycodone     Pt stated "he didn't feel right on medication"  . Skelaxin     Irritability, insomnia, palpitations  . Tramadol     insomnia  . Tussionex Pennkinetic Er [Hydrocod Polst-Cpm Polst Er]     unknown  . Levofloxacin Rash  . Sulfa Antibiotics Rash   Outpatient Prescriptions Prior to Visit  Medication Sig Dispense Refill  . atenolol (TENORMIN) 50 MG tablet Take 50 mg by mouth every morning.     . clopidogrel (PLAVIX) 75 MG tablet Take 75 mg by mouth every morning.     Marland Kitchen desonide (DESOWEN) 0.05 % cream Apply 1 application topically daily as needed. For red bumps on face    . fish oil-omega-3 fatty acids 1000 MG capsule Take 1 g by mouth 3 (three) times daily after meals.    . fluticasone (FLONASE) 50 MCG/ACT nasal spray Place 1 spray into both nostrils at  bedtime.     . Fluticasone-Salmeterol (ADVAIR DISKUS) 100-50 MCG/DOSE AEPB Inhale 1 puff into the lungs 2 (two) times daily.     . hydrOXYzine (ATARAX/VISTARIL) 10 MG tablet Take 20 mg by mouth at bedtime. for insomnia.    . Magnesium 500 MG CAPS Take 500 mg by mouth every morning.    . Melatonin 10 MG CAPS Take 10 mg by mouth at bedtime.    . Multiple Vitamin (MULTIVITAMIN) tablet Take 1 tablet by mouth every morning.     . RABEprazole (ACIPHEX) 20 MG tablet TAKE 1 TABLET BY MOUTH TWICE DAILY 60 tablet 0  . Saw Palmetto 450 MG CAPS Take 900 mg by mouth 3 (three) times daily after meals.     . simvastatin (ZOCOR) 40 MG tablet Take 40 mg by mouth daily. Pt takes 1/2 tablet by mouth daily  5  . diclofenac sodium (VOLTAREN) 1 % GEL Apply 1 application topically 2 (two) times daily as needed. Reported on 03/04/2016    . nitroGLYCERIN (NITROSTAT) 0.4 MG SL tablet Place 0.4 mg under the tongue every 5 (five) minutes as needed. Reported on 03/04/2016    . Cranberry 500 MG CAPS Take 1,000 mg by mouth 3 (three) times daily after meals.     Marland Kitchen  meloxicam (MOBIC) 15 MG tablet Take 15 mg by mouth every morning.     No facility-administered medications prior to visit.   Past Medical History  Diagnosis Date  . Adenomatous colon polyp   . Diverticulosis   . Internal hemorrhoids   . Esophageal stricture   . Hypertension   . Hyperlipidemia   . GERD (gastroesophageal reflux disease)   . Gallstones   . PSVT (paroxysmal supraventricular tachycardia) ([facility])   . COPD (chronic obstructive pulmonary disease) ([facility])   . Cataract   . Bell's palsy   . CVA (cerebral infarction)   . Plantar fasciitis   . Renal cyst, right   . Anxiety   . CVD (cardiovascular disease)   . Vitamin D deficiency   . Asthma   . Osteoarthritis   . External hemorrhoids   . HH (hiatus hernia)   . Disc herniation     L4-L5, S/P epiduiral steroids  . Colon polyp   . Renal cyst, right     Uro Dr [name]  . BPH (benign prostatic  hypertrophy)     reason for microscopic hematuria Dr [name]   Past Surgical History  Procedure Laterality Date  . Appendectomy    . Nasal sinus surgery    . Inguinal hernia repair Bilateral   . Colonoscopy    . Upper gastrointestinal endoscopy    . Cholecystectomy  08/12/2012    Procedure: LAPAROSCOPIC CHOLECYSTECTOMY WITH INTRAOPERATIVE CHOLANGIOGRAM;  Surgeon: Ralene Ok, MD;  Location: [facility];  Service: General;  Laterality: N/A;   Social History   Social History  . Marital Status: Married    Spouse Name: N/A  . Number of Children: 1  . Years of Education: N/A   Occupational History  . Retired     Social History Main Topics  . Smoking status: Never Smoker   . Smokeless tobacco: Never Used  . Alcohol Use: No  . Drug Use: No  . Sexual Activity: Not Asked   Other Topics Concern  . None   Social History Narrative   Daily caffeine    Family History  Problem Relation Age of Onset  . Colon cancer Sister   . Diabetes Brother     x 2  . Prostate cancer Brother     x 2  . Breast cancer Sister   . Pancreatic cancer Sister   . Aneurysm Mother     brain  . Heart disease Mother   . Breast cancer Mother   . Stroke Mother   . CVA Mother   . Heart disease Father   . Diabetes Father   . Parkinsonism Father   . Heart attack Father      Review of Systems See HPI, all other systems negative.    Objective:   Physical Exam  @BP  128/76 mmHg  Pulse 72  Ht 5' 9.25" (1.759 m)  Wt 176 lb 6 oz (80.003 kg)  BMI 25.86 kg/m2@  General:  NAD Eyes:   anicteric Lungs:  clear Heart:: S1S2 no rubs, murmurs or gallops Abdomen:  soft and nontender, BS+ Rectal: External skin tags noted. Anoscopy: Grade 2 hemorrhoids  Ext:   no edema, cyanosis or clubbing   Data Reviewed:  2013 colonoscopy - adenoma, hemorrhoids and diverticulosis    Assessment & Plan:   Encounter Diagnoses  Name Primary?  . Rectal pain - mild Yes  . Prolapsed internal hemorrhoids, grade 2     Grade 2 hemorrhoids noted on anoscopy with no bleeding. Educated the patient  on internal hemorrhoids and will continue preparation H. Recall colonoscopy is next year.    Grayland Ormond PA-S I have seen the patient with Mr. Venetia Maxon and he has served as a Education administrator.  I appreciate the opportunity to care for this patient.  [phone], Gwyndolyn Saxon, MD

## 2016-03-04 NOTE — Patient Instructions (Signed)
   Today we are giving you a handout to read about hemorrhoids.    Use over the counter Preparation H as needed.     I appreciate the opportunity to care for you. Silvano Rusk, MD, [facility]

## 2016-03-13 ENCOUNTER — Other Ambulatory Visit: Payer: Self-pay | Admitting: Cardiology

## 2016-03-31 ENCOUNTER — Other Ambulatory Visit: Payer: Self-pay | Admitting: Cardiology

## 2016-06-23 ENCOUNTER — Other Ambulatory Visit: Payer: Self-pay | Admitting: Cardiology

## 2016-07-22 ENCOUNTER — Other Ambulatory Visit: Payer: Self-pay | Admitting: Cardiology

## 2016-09-17 ENCOUNTER — Ambulatory Visit
Admission: RE | Admit: 2016-09-17 | Discharge: 2016-09-17 | Disposition: A | Discharge status: Home / Self Care | Payer: Medicare Other | Source: Ambulatory Visit | Attending: Family Medicine | Admitting: Family Medicine

## 2016-09-17 ENCOUNTER — Other Ambulatory Visit: Payer: Self-pay | Admitting: Family Medicine

## 2016-09-17 DIAGNOSIS — R0789 Other chest pain: Secondary | ICD-10-CM

## 2016-09-17 DIAGNOSIS — J40 Bronchitis, not specified as acute or chronic: Secondary | ICD-10-CM

## 2016-11-07 ENCOUNTER — Telehealth: Payer: Self-pay

## 2016-11-07 NOTE — Telephone Encounter (Signed)
Faxed to [facility].  Fax: [phone]

## 2016-11-07 NOTE — Telephone Encounter (Signed)
Received clearance request from [facility] (Dr. Junious Silk) for permission to hold Plavix 5 days prior to having prostate biopsy 12/12/16.  To Dr. Radford Pax for recommendations.

## 2016-11-07 NOTE — Telephone Encounter (Signed)
I did not place patient on Plavix.  He is on it for prior CVA so need to get clearance from PCP

## 2017-01-05 ENCOUNTER — Encounter: Payer: Self-pay | Admitting: Radiation Oncology

## 2017-01-06 ENCOUNTER — Encounter: Payer: Self-pay | Admitting: Radiation Oncology

## 2017-01-06 NOTE — Progress Notes (Signed)
GU Location of Tumor / Histology: prostatic adenocarcinoma  If Prostate Cancer, Gleason Score is (4 + 3) and PSA is (6.51) on March 2018. Prostate volume: 19.47 grams.   Reports he presented to his PCP, Adolph Pollack, in February 2018 and it was found that his PSA had quadrupled. The patient reports his PCP repeated testing for a few months before referring him to a urologist.  [name] has had an elevated PSA since August 2010.   03/2009  0.65 03/2012  1.12 07/2016 5.71 09/2016  6.96 10/2016  6.51  Biopsies of prostate (if applicable) revealed:    Past/Anticipated interventions by urology, if any: prostate biopsy, referral to Dr. Tammi Klippel  Past/Anticipated interventions by medical oncology, if any: no  Weight changes, if any: Lost five pounds recently after increasing his intake of raw veggies and fruit. Reports his bowels "can't hold as much" and believe he needs a "lower GI to see if the cancer has gone there."   Bowel/Bladder complaints, if any: IPSS 2. Denies dysuria, hematuria, leakage or incontinence.   Nausea/Vomiting, if any: no  Pain issues, if any:  Reports bilateral rib pain when he jerks or laughs.   SAFETY ISSUES:  Prior radiation? no  Pacemaker/ICD? no  Possible current pregnancy? no  Is the patient on methotrexate? no  Current Complaints / other details:  76 year old male. Married. Retired. Accompanied today by his wife and son. Weighing seeds vs external beam.

## 2017-01-07 ENCOUNTER — Ambulatory Visit
Admission: RE | Admit: 2017-01-07 | Discharge: 2017-01-07 | Disposition: A | Discharge status: Home / Self Care | Payer: Medicare Other | Source: Ambulatory Visit | Attending: Radiation Oncology | Admitting: Radiation Oncology

## 2017-01-07 ENCOUNTER — Encounter: Payer: Self-pay | Admitting: Radiation Oncology

## 2017-01-07 DIAGNOSIS — Z79899 Other long term (current) drug therapy: Secondary | ICD-10-CM | POA: Diagnosis not present

## 2017-01-07 DIAGNOSIS — Z823 Family history of stroke: Secondary | ICD-10-CM | POA: Insufficient documentation

## 2017-01-07 DIAGNOSIS — Z86010 Personal history of colonic polyps: Secondary | ICD-10-CM | POA: Insufficient documentation

## 2017-01-07 DIAGNOSIS — E559 Vitamin D deficiency, unspecified: Secondary | ICD-10-CM | POA: Diagnosis not present

## 2017-01-07 DIAGNOSIS — Z833 Family history of diabetes mellitus: Secondary | ICD-10-CM | POA: Diagnosis not present

## 2017-01-07 DIAGNOSIS — K219 Gastro-esophageal reflux disease without esophagitis: Secondary | ICD-10-CM | POA: Diagnosis not present

## 2017-01-07 DIAGNOSIS — Z888 Allergy status to other drugs, medicaments and biological substances status: Secondary | ICD-10-CM | POA: Diagnosis not present

## 2017-01-07 DIAGNOSIS — I10 Essential (primary) hypertension: Secondary | ICD-10-CM | POA: Diagnosis not present

## 2017-01-07 DIAGNOSIS — Z881 Allergy status to other antibiotic agents status: Secondary | ICD-10-CM | POA: Diagnosis not present

## 2017-01-07 DIAGNOSIS — J449 Chronic obstructive pulmonary disease, unspecified: Secondary | ICD-10-CM | POA: Insufficient documentation

## 2017-01-07 DIAGNOSIS — Z7951 Long term (current) use of inhaled steroids: Secondary | ICD-10-CM | POA: Diagnosis not present

## 2017-01-07 DIAGNOSIS — Z8673 Personal history of transient ischemic attack (TIA), and cerebral infarction without residual deficits: Secondary | ICD-10-CM | POA: Insufficient documentation

## 2017-01-07 DIAGNOSIS — M5126 Other intervertebral disc displacement, lumbar region: Secondary | ICD-10-CM | POA: Diagnosis not present

## 2017-01-07 DIAGNOSIS — J45909 Unspecified asthma, uncomplicated: Secondary | ICD-10-CM | POA: Diagnosis not present

## 2017-01-07 DIAGNOSIS — E785 Hyperlipidemia, unspecified: Secondary | ICD-10-CM | POA: Insufficient documentation

## 2017-01-07 DIAGNOSIS — M1990 Unspecified osteoarthritis, unspecified site: Secondary | ICD-10-CM | POA: Insufficient documentation

## 2017-01-07 DIAGNOSIS — Z800 Family history of malignant neoplasm of digestive organs: Secondary | ICD-10-CM | POA: Diagnosis not present

## 2017-01-07 DIAGNOSIS — Z803 Family history of malignant neoplasm of breast: Secondary | ICD-10-CM | POA: Insufficient documentation

## 2017-01-07 DIAGNOSIS — Z885 Allergy status to narcotic agent status: Secondary | ICD-10-CM | POA: Insufficient documentation

## 2017-01-07 DIAGNOSIS — Z8249 Family history of ischemic heart disease and other diseases of the circulatory system: Secondary | ICD-10-CM | POA: Diagnosis not present

## 2017-01-07 DIAGNOSIS — C61 Malignant neoplasm of prostate: Secondary | ICD-10-CM | POA: Insufficient documentation

## 2017-01-07 DIAGNOSIS — F419 Anxiety disorder, unspecified: Secondary | ICD-10-CM | POA: Insufficient documentation

## 2017-01-07 HISTORY — DX: Malignant neoplasm of prostate: C61

## 2017-01-07 NOTE — Progress Notes (Signed)
See progress note under physician encounter. 

## 2017-01-07 NOTE — Progress Notes (Signed)
Radiation Oncology         [phone] ________________________________  Initial outpatient Consultation  Name: Gregory Pham MRN: [id number]  Date: 01/07/2017  DOB: 04-10-1941  SW:FUXNAT, Gwyndolyn Saxon, MD  Festus Aloe, MD   REFERRING PHYSICIAN: Festus Aloe, MD  DIAGNOSIS: 76 y.o. gentleman with Stage T1c adenocarcinoma of the prostate with Gleason Score of 4+3, and PSA of 6.96.    ICD-9-CM ICD-10-CM   1. Malignant neoplasm of prostate ([place]) 185 C61     HISTORY OF PRESENT ILLNESS: Gregory Pham is a 76 y.o. male with a diagnosis of prostate cancer. He is an established patient of Dr. Junious Silk, followed for microscopic hematuria and was recently noted to have an elevated PSA of 5.71 in 07/2016, digital rectal examination was performed at that time revealing normal consistency bilaterally without discrete nodularity.  A repeat PSA was performed in 09/2016 and was noted further elevated at 6.96.  The patient proceeded to transrectal ultrasound with 12 biopsies of the prostate on 12/12/16.  The prostate volume measured 19.47 cc.  Out of 12 core biopsies, 3 were positive.  The maximum Gleason score was 4+3, and this was seen in left mid and left mid-lateral. Additionally, there was Gleason 3+3 disease in the right apex.  PSA Trend 03/2009             0.65 03/2012             1.12 07/2016           5.71 09/2016             6.96 10/2016             6.51  The patient reviewed the biopsy results with his urologist and he has kindly been referred today for discussion of potential radiation treatment options.   PREVIOUS RADIATION THERAPY: No  PAST MEDICAL HISTORY:  Past Medical History:  Diagnosis Date  . Adenomatous colon polyp   . Anxiety   . Asthma   . Bell's palsy   . BPH (benign prostatic hypertrophy)    reason for microscopic hematuria Dr [name]  . Cataract   . Colon polyp   . COPD (chronic obstructive pulmonary disease) ([place])   . CVA (cerebral infarction)   . CVD  (cardiovascular disease)   . Disc herniation    L4-L5, S/P epiduiral steroids  . Diverticulosis   . Esophageal stricture   . External hemorrhoids   . Gallstones   . GERD (gastroesophageal reflux disease)   . HH (hiatus hernia)   . Hyperlipidemia   . Hypertension   . Internal hemorrhoids   . Osteoarthritis   . Plantar fasciitis   . Prostate cancer ([place])   . PSVT (paroxysmal supraventricular tachycardia) ([place])   . Renal cyst, right   . Renal cyst, right    Uro Dr [name]  . Vitamin D deficiency       PAST SURGICAL HISTORY: Past Surgical History:  Procedure Laterality Date  . APPENDECTOMY    . CHOLECYSTECTOMY  08/12/2012   Procedure: LAPAROSCOPIC CHOLECYSTECTOMY WITH INTRAOPERATIVE CHOLANGIOGRAM;  Surgeon: Ralene Ok, MD;  Location: [facility];  Service: General;  Laterality: N/A;  . COLONOSCOPY    . INGUINAL HERNIA REPAIR Bilateral   . NASAL SINUS SURGERY    . UPPER GASTROINTESTINAL ENDOSCOPY      FAMILY HISTORY:  Family History  Problem Relation Age of Onset  . Aneurysm Mother        brain  . Heart disease Mother   .  Breast cancer Mother   . Stroke Mother   . CVA Mother   . Heart disease Father   . Diabetes Father   . Parkinsonism Father   . Heart attack Father   . Colon cancer Sister   . Diabetes Brother        x 2  . Prostate cancer Brother        x 2  . Breast cancer Sister   . Pancreatic cancer Sister     SOCIAL HISTORY:  Social History   Social History  . Marital status: Married    Spouse name: N/A  . Number of children: 1  . Years of education: N/A   Occupational History  . Retired  Retired   Social History Main Topics  . Smoking status: Never Smoker  . Smokeless tobacco: Never Used  . Alcohol use No  . Drug use: No  . Sexual activity: No   Other Topics Concern  . Not on file   Social History Narrative   Daily caffeine     ALLERGIES: Azithromycin; Cephalexin; Clarithromycin; Clindamycin/lincomycin; Darvocet [propoxyphene  n-acetaminophen]; Flexeril [cyclobenzaprine hcl]; Flomax [tamsulosin hcl]; Hydrocodone; Hyoscyamine; Naprosyn [naproxen]; Oxycodone; Skelaxin; Tramadol; Tussionex pennkinetic er [employer] er]; Levofloxacin; and Sulfa antibiotics  MEDICATIONS:  Current Outpatient Prescriptions  Medication Sig Dispense Refill  . atenolol (TENORMIN) 50 MG tablet Take 50 mg by mouth every morning.     . Cholecalciferol (VITAMIN D) 2000 units CAPS Take 1 capsule by mouth daily.    . clopidogrel (PLAVIX) 75 MG tablet Take 75 mg by mouth every morning.     Marland Kitchen desonide (DESOWEN) 0.05 % cream Apply 1 application topically daily as needed. For red bumps on face    . diclofenac sodium (VOLTAREN) 1 % GEL Apply 1 application topically 2 (two) times daily as needed. Reported on 03/04/2016    . fish oil-omega-3 fatty acids 1000 MG capsule Take 1 g by mouth 3 (three) times daily after meals.    . fluticasone (FLONASE) 50 MCG/ACT nasal spray Place 1 spray into both nostrils at bedtime.     . Fluticasone-Salmeterol (ADVAIR DISKUS) 100-50 MCG/DOSE AEPB Inhale 1 puff into the lungs 2 (two) times daily.     . hydrOXYzine (ATARAX/VISTARIL) 10 MG tablet Take 20 mg by mouth at bedtime. for insomnia.    . Magnesium 500 MG CAPS Take 500 mg by mouth every morning.    . Melatonin 10 MG CAPS Take 10 mg by mouth at bedtime.    . Multiple Vitamin (MULTIVITAMIN) tablet Take 1 tablet by mouth every morning.     . RABEprazole (ACIPHEX) 20 MG tablet TAKE 1 TABLET BY MOUTH TWICE DAILY 60 tablet 0  . ranitidine (ZANTAC) 300 MG tablet Take 300 mg by mouth at bedtime.    . Saw Palmetto 450 MG CAPS Take 900 mg by mouth 3 (three) times daily after meals.     . simvastatin (ZOCOR) 20 MG tablet Take 20 mg by mouth daily.    . metroNIDAZOLE (METROCREAM) 0.75 % cream APP ON THE SKIN TWICE A DAY  5  . nitroGLYCERIN (NITROSTAT) 0.4 MG SL tablet Place 0.4 mg under the tongue every 5 (five) minutes as needed. Reported on 03/04/2016     No  current facility-administered medications for this encounter.     REVIEW OF SYSTEMS:  On review of systems, the patient reports that he is doing well overall. He denies any chest pain, shortness of breath, cough, fevers, chills, night sweats, unintended  weight changes. He has a h/o COPD which is well controlled with Advair daily.  He reports a remote history of 2 mini-strokes approximately 8 years ago but denies any recent symptoms.  He is maintained on Atenolol and Plavix for prophylaxis.  His cardiologist if Golden Hurter, MD.  He denies any bowel disturbances, and denies abdominal pain, nausea or vomiting. He denies any new musculoskeletal or joint aches or pains. His IPSS was 2, indicating mild urinary symptoms. He is able to complete sexual activity with most attempts. A complete review of systems is obtained and is otherwise negative.    PHYSICAL EXAM:  Wt Readings from Last 3 Encounters:  01/07/17 178 lb 3.2 oz (80.8 kg)  03/04/16 176 lb 6 oz (80 kg)  12/29/14 174 lb 12.8 oz (79.3 kg)   Temp Readings from Last 3 Encounters:  08/26/12 97.4 F (36.3 C) (Temporal)  08/12/12 98.7 F (37.1 C) (Oral)  08/04/12 98 F (36.7 C)   BP Readings from Last 3 Encounters:  01/07/17 (!) 146/78  03/04/16 128/76  12/29/14 132/74   Pulse Readings from Last 3 Encounters:  01/07/17 68  03/04/16 72  12/29/14 60   In general this is a well appearing caucasian male in no acute distress. He is alert and oriented x4 and appropriate throughout the examination. HEENT reveals that the patient is normocephalic, atraumatic. EOMs are intact. PERRLA. Skin is intact without any evidence of gross lesions. Cardiovascular exam reveals a regular rate and rhythm, no clicks rubs or murmurs are auscultated. Chest is clear to auscultation bilaterally. Lymphatic assessment is performed and does not reveal any adenopathy in the cervical, supraclavicular, axillary, or inguinal chains. Abdomen has active bowel sounds in all  quadrants and is intact. The abdomen is soft, non tender, non distended. Lower extremities are negative for pretibial pitting edema, deep calf tenderness, cyanosis or clubbing.   KPS = 100  100 - Normal; no complaints; no evidence of disease. 90   - Able to carry on normal activity; minor signs or symptoms of disease. 80   - Normal activity with effort; some signs or symptoms of disease. 60   - Cares for self; unable to carry on normal activity or to do active work. 60   - Requires occasional assistance, but is able to care for most of his personal needs. 50   - Requires considerable assistance and frequent medical care. 75   - Disabled; requires special care and assistance. 4   - Severely disabled; hospital admission is indicated although death not imminent. 61   - Very sick; hospital admission necessary; active supportive treatment necessary. 10   - Moribund; fatal processes progressing rapidly. 0     - Dead  Karnofsky DA, Abelmann [place], Craver LS and [place] [facility] [phone]) The use of the nitrogen mustards in the palliative treatment of carcinoma: with particular reference to bronchogenic carcinoma Cancer 1 634-56  LABORATORY DATA:  Lab Results  Component Value Date   WBC 8.3 12/07/2013   HGB 15.8 12/07/2013   HCT 46.5 12/07/2013   MCV 88.9 12/07/2013   PLT 167.0 12/07/2013   Lab Results  Component Value Date   NA 139 12/07/2013   K 4.2 12/07/2013   CL 103 12/07/2013   CO2 28 12/07/2013   Lab Results  Component Value Date   ALT 32 10/21/2010   AST 27 10/21/2010   ALKPHOS 49 10/21/2010   BILITOT 0.7 10/21/2010     RADIOGRAPHY: No results found.    IMPRESSION/PLAN:  1. 76 y.o. gentleman with Stage T1c adenocarcinoma of the prostate with Gleason Score of 4+3, and PSA of 6.96.  Today we reviewed the findings and workup thus far.  We discussed the natural history of prostate cancer.  We reviewed the the implications of T-stage, Gleason's Score, and PSA on decision-making and  outcomes in prostate cancer.  We discussed radiation treatment in the management of prostate cancer with regard to the logistics and delivery of external beam radiation treatment as well as the logistics and delivery of prostate brachytherapy.  We compared and contrasted each of these approaches and also compared these against prostatectomy.  The patient expressed interest in prostate brachytherapy. The patient met briefly with Romie Jumper in our office who will be working closely with him to coordinate OR scheduling and pre and post procedure appointments.  We will contact the pharmaceutical rep to ensure that [facility] is available at the time of procedure.  He will have a prostate MRI following his post-seed CT SIM to confirm appropriate distribution of the [place]..  The patient would like to proceed with prostate brachytherapy.  I will share my findings with Dr. Junious Silk and move forward with scheduling the procedure in the near future.   We will request cardiac clearance from his cardiologist, Dr. Golden Hurter.  I enjoyed meeting with him today, and will look forward to participating in the care of this very nice gentleman.   Nicholos Johns, PA-C    Tyler Pita, MD  [facility] Direct Dial: [phone]  Fax: [phone] [email or web address]  Skype  [email or web address]  This document serves as a record of services personally performed by Tyler Pita, MD and Freeman Caldron, PA-C. It was created on their behalf by Linward Natal, a trained medical scribe. The creation of this record is based on the scribe's personal observations and the provider's statements to them. This document has been checked and approved by the attending provider.

## 2017-01-08 ENCOUNTER — Telehealth: Payer: Self-pay | Admitting: *Deleted

## 2017-01-08 ENCOUNTER — Telehealth: Payer: Self-pay | Admitting: Medical Oncology

## 2017-01-08 ENCOUNTER — Telehealth: Payer: Self-pay

## 2017-01-08 NOTE — Telephone Encounter (Signed)
-----   Message from Sueanne Margarita, MD sent at 01/08/2017 11:08 AM EDT -----  He has not been seen since 2016. Needs to workin with extender  Fransico Him ----- Message ----- From: Freeman Caldron, PA-C Sent: 01/07/2017   5:30 PM To: Sueanne Margarita, MD  FYI- Patient would like to proceed with brachytherapy for treatment of prostate cancer.  Will need cardiac clearance prior to procedure.  This office will send over a request for your completion.  Thank you! -Ashlyn

## 2017-01-08 NOTE — Progress Notes (Signed)
Left message to introduce myself and role as prostate nurse navigator. I was unable to meet him during his consult with Dr. Tammi Klippel. I asked him to call me with questions or concerns.

## 2017-01-08 NOTE — Telephone Encounter (Signed)
Patient has scheduled appointment 6/6.

## 2017-01-08 NOTE — Telephone Encounter (Signed)
CALLED PATIENT TO INFORM OF APPT. WITH DR. Theodosia Blender PA DAYNA DUNN ON 01-21-17- ARRIVAL TIME - 3:15 PM FOR CARDIAC CLEARANCE, LVM FOR A RETURN CALL

## 2017-01-08 NOTE — Progress Notes (Signed)
He has not been seen in 2 years.  Please work in with extender

## 2017-01-21 ENCOUNTER — Encounter: Payer: Self-pay | Admitting: Physician Assistant

## 2017-01-21 ENCOUNTER — Ambulatory Visit (INDEPENDENT_AMBULATORY_CARE_PROVIDER_SITE_OTHER): Payer: Medicare Other | Admitting: Physician Assistant

## 2017-01-21 ENCOUNTER — Encounter (INDEPENDENT_AMBULATORY_CARE_PROVIDER_SITE_OTHER): Payer: Self-pay

## 2017-01-21 VITALS — BP 128/80 | HR 62 | Ht 69.25 in | Wt 177.0 lb

## 2017-01-21 DIAGNOSIS — I10 Essential (primary) hypertension: Secondary | ICD-10-CM | POA: Diagnosis not present

## 2017-01-21 DIAGNOSIS — I351 Nonrheumatic aortic (valve) insufficiency: Secondary | ICD-10-CM | POA: Diagnosis not present

## 2017-01-21 DIAGNOSIS — I471 Supraventricular tachycardia: Secondary | ICD-10-CM

## 2017-01-21 DIAGNOSIS — I77810 Thoracic aortic ectasia: Secondary | ICD-10-CM

## 2017-01-21 DIAGNOSIS — Z01810 Encounter for preprocedural cardiovascular examination: Secondary | ICD-10-CM

## 2017-01-21 NOTE — Patient Instructions (Signed)

## 2017-01-21 NOTE — Progress Notes (Signed)
Cardiology Office Note    Date:  01/21/2017  ID:  Gregory Pham, DOB [birth year], MRN [id number] PCP:  Gregory Frees, MD  Cardiologist:  Dr. Radford Pax   Chief Complaint: pre-op cardiac clearance  History of Present Illness:  Gregory Pham is a 76 y.o. male with history of PSVT, mild AI, mildly dilated aortic root, diverticulosis, internal hemorrhoids, HTN, HLD, GERD, COPD, Bells palsy, CVA, asthma, osteoarthritis, BPH who presents for cardiac pre-op evaluation. He has history of remote PSVT, well-managed with atenolol. Last echo 07/2015 showed EF 55-60%, grade 2 DD, trivial AI, aortic root not dilated. Prior nuc 07/2015 was unremarkable with EF 74%, low risk study. Last labs in 2015 showed normal CBC, BMET.  He presents today for pre-op evaluation for brachytherapy for treatment of prostate CA. From cardic perspective he feels he is doing great. He remains very active. He weed-eats and mows his own yard regularly without any exertional angina or shortness of breath. His PSVT has been well controlled on atenololol and he has not had any recent palpitations. He does feel an occasional stitch in his side if he laughs too hard with his great grandchildren but was told by his PCP this is likely muscle spasm.  Past Medical History:  Diagnosis Date  . Adenomatous colon polyp   . Anxiety   . Asthma   . Bell's palsy   . BPH (benign prostatic hypertrophy)    reason for microscopic hematuria Dr [name]  . Cataract   . Colon polyp   . COPD (chronic obstructive pulmonary disease) ([place])   . CVA (cerebral infarction)   . CVD (cardiovascular disease)   . Disc herniation    L4-L5, S/P epiduiral steroids  . Diverticulosis   . Esophageal stricture   . External hemorrhoids   . Gallstones   . GERD (gastroesophageal reflux disease)   . HH (hiatus hernia)   . Hyperlipidemia   . Hypertension   . Internal hemorrhoids   . Osteoarthritis   . Plantar fasciitis   . Prostate cancer ([place])   . PSVT  (paroxysmal supraventricular tachycardia) ([place])   . Renal cyst, right    Uro Dr [name]  . Vitamin D deficiency     Past Surgical History:  Procedure Laterality Date  . APPENDECTOMY    . CHOLECYSTECTOMY  08/12/2012   Procedure: LAPAROSCOPIC CHOLECYSTECTOMY WITH INTRAOPERATIVE CHOLANGIOGRAM;  Surgeon: Gregory Ok, MD;  Location: [facility];  Service: General;  Laterality: N/A;  . COLONOSCOPY    . INGUINAL HERNIA REPAIR Bilateral   . NASAL SINUS SURGERY    . UPPER GASTROINTESTINAL ENDOSCOPY      Current Medications: Current Outpatient Prescriptions  Medication Sig Dispense Refill  . atenolol (TENORMIN) 50 MG tablet Take 50 mg by mouth every morning.     . Cholecalciferol (VITAMIN D) 2000 units CAPS Take 1 capsule by mouth daily.    . clopidogrel (PLAVIX) 75 MG tablet Take 75 mg by mouth every morning.     . fish oil-omega-3 fatty acids 1000 MG capsule Take 1 g by mouth 3 (three) times daily after meals.    . fluticasone (FLONASE) 50 MCG/ACT nasal spray Place 1 spray into both nostrils at bedtime.     . Fluticasone-Salmeterol (ADVAIR DISKUS) 100-50 MCG/DOSE AEPB Inhale 1 puff into the lungs 2 (two) times daily.     . hydrOXYzine (ATARAX/VISTARIL) 10 MG tablet Take 20 mg by mouth at bedtime. for insomnia.    . Magnesium 500 MG CAPS Take 500 mg by  mouth every morning.    . Melatonin 3 MG CAPS Take 3 mg by mouth at bedtime.    . Multiple Vitamin (MULTIVITAMIN) tablet Take 1 tablet by mouth every morning.     . nitroGLYCERIN (NITROSTAT) 0.4 MG SL tablet Place 0.4 mg under the tongue every 5 (five) minutes as needed. Reported on 03/04/2016    . RABEprazole (ACIPHEX) 20 MG tablet TAKE 1 TABLET BY MOUTH TWICE DAILY 60 tablet 0  . ranitidine (ZANTAC) 300 MG tablet Take 300 mg by mouth at bedtime.    . Saw Palmetto 450 MG CAPS Take 900 mg by mouth 3 (three) times daily after meals.     . simvastatin (ZOCOR) 20 MG tablet Take 20 mg by mouth daily.    . diclofenac sodium (VOLTAREN) 1 % GEL Apply  1 application topically 2 (two) times daily as needed. Reported on 03/04/2016    . metroNIDAZOLE (METROCREAM) 0.75 % cream APP ON THE SKIN TWICE A DAY  5   No current facility-administered medications for this visit.      Allergies:   Azithromycin; Cephalexin; Clarithromycin; Clindamycin/lincomycin; Darvocet [propoxyphene n-acetaminophen]; Flexeril [cyclobenzaprine hcl]; Flomax [tamsulosin hcl]; Hydrocodone; Hyoscyamine; Naprosyn [naproxen]; Oxycodone; Skelaxin; Tramadol; Tussionex pennkinetic er [employer] er]; Levofloxacin; and Sulfa antibiotics   Social History   Social History  . Marital status: Married    Spouse name: N/A  . Number of children: 1  . Years of education: N/A   Occupational History  . Retired  Retired   Social History Main Topics  . Smoking status: Never Smoker  . Smokeless tobacco: Never Used  . Alcohol use No  . Drug use: No  . Sexual activity: No   Other Topics Concern  . None   Social History Narrative   Daily caffeine      Family History:  Family History  Problem Relation Age of Onset  . Aneurysm Mother        brain  . Heart disease Mother   . Breast cancer Mother   . Stroke Mother   . CVA Mother   . Heart disease Father   . Diabetes Father   . Parkinsonism Father   . Heart attack Father   . Colon cancer Sister   . Diabetes Brother        x 2  . Prostate cancer Brother        x 2  . Breast cancer Sister   . Pancreatic cancer Sister      ROS:   Please see the history of present illness.  All other systems are reviewed and otherwise negative.    PHYSICAL EXAM:   VS:  BP 128/80   Pulse 62   Ht 5' 9.25" (1.759 m)   Wt 177 lb (80.3 kg)   BMI 25.95 kg/m   BMI: Body mass index is 25.95 kg/m. GEN: Well nourished, well developed WM in no acute distress  HEENT: normocephalic, atraumatic Neck: no JVD, carotid bruits, or masses Cardiac: RRR; no murmurs, rubs, or gallops, no edema  Respiratory:  clear to auscultation  bilaterally, normal work of breathing GI: soft, nontender, nondistended, + BS MS: no deformity or atrophy  Skin: warm and dry, no rash Neuro:  Alert and Oriented x 3, Strength and sensation are intact, follows commands Psych: euthymic mood, full affect  Wt Readings from Last 3 Encounters:  01/21/17 177 lb (80.3 kg)  01/07/17 178 lb 3.2 oz (80.8 kg)  03/04/16 176 lb 6 oz (80 kg)  Studies/Labs Reviewed:   EKG:  EKG was ordered today and personally reviewed by me and demonstrates NSR 62bpm, left axis deviation, TW flattening avL, otherwise nonacute.  Recent Labs: No results found for requested labs within last 8760 hours.   Additional studies/ records that were reviewed today include: Summarized above.   ASSESSMENT & PLAN:   1. Pre-operative cardiovascular exam: Mr. Mattie has no known history of CAD and has not had any recent anginal symptoms. EKG is generally unchanged from prior. He is cleared to proceed with brachytherapy procedure without further cardiovascular testing. Of note he is on chronic clopidogrel for history of stroke - cardiology does not manage this so would defer further decision making regarding cessation for surgery to primary care. 2. PSVT - quiescent. Continue atenolol. Would continue beta blocker perioperatively to avoid potentiating rhythm disturbance. 3. Mild AI - no significant murmur on exam. Follow clinically. Will arrange f/u appt in 6 months at which time Dr. Radford Pax can review timing of future surveillance echo. 4. Essential HTN - controlled. 5. Dilated aortic root - not seen on 2016 echo. No symptoms to suggest worsening aortic insufficiency.  Disposition: F/u with Dr. Radford Pax in 07/2017 to re-establish. Have routed this note to Dr. Tammi Klippel.   Medication Adjustments/Labs and Tests Ordered: Current medicines are reviewed at length with the patient today.  Concerns regarding medicines are outlined above. Medication changes, Labs and Tests ordered  today are summarized above and listed in the Patient Instructions accessible in Encounters.   Signed, Charlie Pitter, PA-C  01/21/2017 3:58 PM    [facility] [place], [place], [place]  [address] Phone: [phone]; Fax: [phone]

## 2017-01-22 ENCOUNTER — Telehealth: Payer: Self-pay | Admitting: Physician Assistant

## 2017-01-22 NOTE — Telephone Encounter (Signed)
OV note with surgical clearance 6/6-faxed via Epic to # provided.

## 2017-01-22 NOTE — Telephone Encounter (Signed)
New message    Request for surgical clearance:  1. What type of surgery is being performed? Prostate implant   2. When is this surgery scheduled? Not scheduled yet, wants clearance beforehand.  3. Are there any medications that need to be held prior to surgery and how long?  Need cardiac clearance  4. Name of physician performing surgery? Dr. Tyler Pita   5. What is your office phone and fax number? [phone]

## 2017-01-23 ENCOUNTER — Encounter: Payer: Self-pay | Admitting: Urology

## 2017-01-23 NOTE — Progress Notes (Signed)
Cardiac clearance received.  I will forward this information to Romie Jumper to move forward with scheduling brachytherapy with [facility] implant (Dr. Junious Silk). Will also request permission from PCP to d/c Plavix x3 days prior to brachytherapy procedure.

## 2017-01-27 ENCOUNTER — Telehealth: Payer: Self-pay | Admitting: Cardiology

## 2017-01-27 NOTE — Telephone Encounter (Signed)
OV note with clearance re-faxed via Epic, attn: [place].

## 2017-01-27 NOTE — Telephone Encounter (Signed)
Gregory Pham is asking that the Cardiac clearance be faxed again to [name]

## 2017-01-28 ENCOUNTER — Telehealth: Payer: Self-pay | Admitting: *Deleted

## 2017-01-28 NOTE — Telephone Encounter (Signed)
CALLED PATIENT TO INFORM OF PRE-SEED PLANNING CT FOR 02-13-17, SPOKE WITH PATIENT AND HE IS AWARE OF THIS APPT.

## 2017-02-02 ENCOUNTER — Telehealth: Payer: Self-pay | Admitting: *Deleted

## 2017-02-02 ENCOUNTER — Other Ambulatory Visit: Payer: Self-pay | Admitting: Urology

## 2017-02-02 NOTE — Telephone Encounter (Signed)
Called patient to inform of pre-seed planning CT on 02-13-17 and his implant on 04-17-17, spoke with patient and he is aware of these appts.

## 2017-02-12 ENCOUNTER — Telehealth: Payer: Self-pay | Admitting: *Deleted

## 2017-02-12 NOTE — Telephone Encounter (Signed)
Called patient to remind of appts. for 02-13-17, spoke with patient and he is aware of these appts.

## 2017-02-13 ENCOUNTER — Encounter: Payer: Self-pay | Admitting: Medical Oncology

## 2017-02-13 ENCOUNTER — Ambulatory Visit
Admission: RE | Admit: 2017-02-13 | Discharge: 2017-02-13 | Disposition: A | Discharge status: Home / Self Care | Payer: Medicare Other | Source: Ambulatory Visit | Attending: Radiation Oncology | Admitting: Radiation Oncology

## 2017-02-13 ENCOUNTER — Ambulatory Visit: Payer: Medicare Other | Admitting: Radiation Oncology

## 2017-02-13 DIAGNOSIS — C61 Malignant neoplasm of prostate: Secondary | ICD-10-CM

## 2017-02-13 NOTE — Progress Notes (Signed)
Mr. Gregory Pham here for pre-seed CT. He is nervous and emotional. He states that he lost his sister [name] to cancer and when he heard he had cancer he was really upset.Now that he has a treatment plan he is feeling much better. He is scheduled seed implant 03/30/17. I asked him to call me with questions or concerns or just to talk.

## 2017-02-13 NOTE — Progress Notes (Signed)
  Radiation Oncology         ([phone]) [phone] ________________________________  Name: Gregory Pham MRN: [id number]  Date: 02/13/2017  DOB: 1941-06-09  SIMULATION AND TREATMENT PLANNING NOTE PUBIC ARCH STUDY  UI:[id number], Gwyndolyn Saxon, MD  Festus Aloe, MD  DIAGNOSIS: 76 y.o. gentleman with Stage T1c adenocarcinoma of the prostate with Gleason Score of 4+3, and PSA of 6.96     ICD-10-CM   1. Malignant neoplasm of prostate ([place]) C61     COMPLEX SIMULATION:  The patient presented today for evaluation for possible prostate seed implant. He was brought to the radiation planning suite and placed supine on the CT couch. A 3-dimensional image study set was obtained in upload to the planning computer. There, on each axial slice, I contoured the prostate gland. Then, using three-dimensional radiation planning tools I reconstructed the prostate in view of the structures from the transperineal needle pathway to assess for possible pubic arch interference. In doing so, I did not appreciate any pubic arch interference. Also, the patient's prostate volume was estimated based on the drawn structure. The volume was 29 cc.  Given the pubic arch appearance and prostate volume, patient remains a good candidate to proceed with prostate seed implant. Today, he freely provided informed written consent to proceed.    PLAN: The patient will undergo prostate seed implant.   ________________________________  Sheral Apley. Tammi Klippel, M.D.

## 2017-02-24 ENCOUNTER — Encounter: Payer: Self-pay | Admitting: Internal Medicine

## 2017-04-09 ENCOUNTER — Telehealth: Payer: Self-pay | Admitting: *Deleted

## 2017-04-09 NOTE — Telephone Encounter (Signed)
CALLED PATIENT TO REMIND OF LABS FOR PROCEDURE ON 04-17-17, LVM FOR A RETURN CALL

## 2017-04-10 ENCOUNTER — Encounter (HOSPITAL_BASED_OUTPATIENT_CLINIC_OR_DEPARTMENT_OTHER): Payer: Self-pay | Admitting: *Deleted

## 2017-04-10 DIAGNOSIS — Z885 Allergy status to narcotic agent status: Secondary | ICD-10-CM | POA: Diagnosis not present

## 2017-04-10 DIAGNOSIS — Z820 Family history of epilepsy and other diseases of the nervous system: Secondary | ICD-10-CM | POA: Diagnosis not present

## 2017-04-10 DIAGNOSIS — N138 Other obstructive and reflux uropathy: Secondary | ICD-10-CM | POA: Diagnosis not present

## 2017-04-10 DIAGNOSIS — Z881 Allergy status to other antibiotic agents status: Secondary | ICD-10-CM | POA: Diagnosis not present

## 2017-04-10 DIAGNOSIS — Z79899 Other long term (current) drug therapy: Secondary | ICD-10-CM | POA: Diagnosis not present

## 2017-04-10 DIAGNOSIS — Z823 Family history of stroke: Secondary | ICD-10-CM | POA: Diagnosis not present

## 2017-04-10 DIAGNOSIS — Z8673 Personal history of transient ischemic attack (TIA), and cerebral infarction without residual deficits: Secondary | ICD-10-CM | POA: Diagnosis not present

## 2017-04-10 DIAGNOSIS — Z886 Allergy status to analgesic agent status: Secondary | ICD-10-CM | POA: Diagnosis not present

## 2017-04-10 DIAGNOSIS — Z882 Allergy status to sulfonamides status: Secondary | ICD-10-CM | POA: Diagnosis not present

## 2017-04-10 DIAGNOSIS — I472 Ventricular tachycardia: Secondary | ICD-10-CM | POA: Diagnosis not present

## 2017-04-10 DIAGNOSIS — G4730 Sleep apnea, unspecified: Secondary | ICD-10-CM | POA: Diagnosis not present

## 2017-04-10 DIAGNOSIS — C61 Malignant neoplasm of prostate: Secondary | ICD-10-CM | POA: Diagnosis present

## 2017-04-10 DIAGNOSIS — J449 Chronic obstructive pulmonary disease, unspecified: Secondary | ICD-10-CM | POA: Diagnosis not present

## 2017-04-10 DIAGNOSIS — Z888 Allergy status to other drugs, medicaments and biological substances status: Secondary | ICD-10-CM | POA: Diagnosis not present

## 2017-04-10 DIAGNOSIS — N401 Enlarged prostate with lower urinary tract symptoms: Secondary | ICD-10-CM | POA: Diagnosis not present

## 2017-04-10 DIAGNOSIS — M1990 Unspecified osteoarthritis, unspecified site: Secondary | ICD-10-CM | POA: Diagnosis not present

## 2017-04-10 DIAGNOSIS — E785 Hyperlipidemia, unspecified: Secondary | ICD-10-CM | POA: Diagnosis not present

## 2017-04-10 DIAGNOSIS — E7800 Pure hypercholesterolemia, unspecified: Secondary | ICD-10-CM | POA: Diagnosis not present

## 2017-04-10 DIAGNOSIS — Z8042 Family history of malignant neoplasm of prostate: Secondary | ICD-10-CM | POA: Diagnosis not present

## 2017-04-10 DIAGNOSIS — I739 Peripheral vascular disease, unspecified: Secondary | ICD-10-CM | POA: Diagnosis not present

## 2017-04-10 DIAGNOSIS — R319 Hematuria, unspecified: Secondary | ICD-10-CM | POA: Diagnosis not present

## 2017-04-10 DIAGNOSIS — R351 Nocturia: Secondary | ICD-10-CM | POA: Diagnosis not present

## 2017-04-10 DIAGNOSIS — Z7902 Long term (current) use of antithrombotics/antiplatelets: Secondary | ICD-10-CM | POA: Diagnosis not present

## 2017-04-10 DIAGNOSIS — Z803 Family history of malignant neoplasm of breast: Secondary | ICD-10-CM | POA: Diagnosis not present

## 2017-04-10 LAB — CBC
HEMATOCRIT: 45.4 % (ref 39.0–52.0)
Hemoglobin: 15.9 g/dL (ref 13.0–17.0)
MCH: 30.2 pg (ref 26.0–34.0)
MCHC: 35 g/dL (ref 30.0–36.0)
MCV: 86.3 fL (ref 78.0–100.0)
Platelets: 138 10*3/uL — ABNORMAL LOW (ref 150–400)
RBC: 5.26 MIL/uL (ref 4.22–5.81)
RDW: 13 % (ref 11.5–15.5)
WBC: 5.7 10*3/uL (ref 4.0–10.5)

## 2017-04-10 LAB — COMPREHENSIVE METABOLIC PANEL
ALBUMIN: 4.6 g/dL (ref 3.5–5.0)
ALK PHOS: 51 U/L (ref 38–126)
ALT: 28 U/L (ref 17–63)
ANION GAP: 8 (ref 5–15)
AST: 37 U/L (ref 15–41)
BILIRUBIN TOTAL: 1 mg/dL (ref 0.3–1.2)
BUN: 16 mg/dL (ref 6–20)
CALCIUM: 9.1 mg/dL (ref 8.9–10.3)
CO2: 25 mmol/L (ref 22–32)
Chloride: 106 mmol/L (ref 101–111)
Creatinine, Ser: 0.86 mg/dL (ref 0.61–1.24)
GLUCOSE: 106 mg/dL — AB (ref 65–99)
POTASSIUM: 4.4 mmol/L (ref 3.5–5.1)
Sodium: 139 mmol/L (ref 135–145)
TOTAL PROTEIN: 7.2 g/dL (ref 6.5–8.1)

## 2017-04-10 LAB — PROTIME-INR
INR: 1.01
PROTHROMBIN TIME: 13.3 s (ref 11.4–15.2)

## 2017-04-10 LAB — APTT: APTT: 30 s (ref 24–36)

## 2017-04-10 NOTE — Progress Notes (Signed)
FACE TO FACE INTERVIEW W/ PT AND WIFE (CAME TO GET LAB WORK).  NPO AFTER MN.  ARRIVE AT 0900.  CURRENT LAB RESULTS , CXR, AND EKG IN CHART AND EPIC.  WILL TAKE ATENOLOL, ACHIPHEX, ZOCOR, AND ADVAIR INHALER AM DOS W/ SIPS OF WATER. WILL DO FLEET ENEMA AM DOS.  PER PT'S PCP CLEARANCE NOTE FAXED FROM DR [facility] OFFICE OK TO STOP PLAVIX 3 DAYS PRIOR TO DOS,  PT VERBALIZED UNDERSTANDING.

## 2017-04-16 ENCOUNTER — Telehealth: Payer: Self-pay | Admitting: *Deleted

## 2017-04-16 NOTE — Telephone Encounter (Signed)
CALLED PATIENT TO REMIND OF PROCEDURE FOR 04-17-17 , SPOKE WITH PATIENT AND HE IS AWARE OF THIS PROCEDURE

## 2017-04-17 ENCOUNTER — Encounter (HOSPITAL_BASED_OUTPATIENT_CLINIC_OR_DEPARTMENT_OTHER): Payer: Self-pay

## 2017-04-17 ENCOUNTER — Telehealth: Payer: Self-pay | Admitting: *Deleted

## 2017-04-17 ENCOUNTER — Ambulatory Visit (HOSPITAL_BASED_OUTPATIENT_CLINIC_OR_DEPARTMENT_OTHER)
Admission: RE | Admit: 2017-04-17 | Discharge: 2017-04-17 | Disposition: A | Discharge status: Home / Self Care | Payer: Medicare Other | Source: Ambulatory Visit | Attending: Urology | Admitting: Urology

## 2017-04-17 ENCOUNTER — Encounter (HOSPITAL_BASED_OUTPATIENT_CLINIC_OR_DEPARTMENT_OTHER): Payer: Self-pay | Admitting: *Deleted

## 2017-04-17 ENCOUNTER — Encounter (HOSPITAL_BASED_OUTPATIENT_CLINIC_OR_DEPARTMENT_OTHER)
Admission: RE | Disposition: A | Discharge status: Home / Self Care | Payer: Self-pay | Source: Ambulatory Visit | Attending: Urology

## 2017-04-17 ENCOUNTER — Other Ambulatory Visit: Payer: Self-pay | Admitting: Urology

## 2017-04-17 DIAGNOSIS — Z79899 Other long term (current) drug therapy: Secondary | ICD-10-CM | POA: Insufficient documentation

## 2017-04-17 DIAGNOSIS — Z888 Allergy status to other drugs, medicaments and biological substances status: Secondary | ICD-10-CM | POA: Insufficient documentation

## 2017-04-17 DIAGNOSIS — Z881 Allergy status to other antibiotic agents status: Secondary | ICD-10-CM | POA: Insufficient documentation

## 2017-04-17 DIAGNOSIS — C61 Malignant neoplasm of prostate: Secondary | ICD-10-CM | POA: Diagnosis not present

## 2017-04-17 DIAGNOSIS — Z8042 Family history of malignant neoplasm of prostate: Secondary | ICD-10-CM | POA: Insufficient documentation

## 2017-04-17 DIAGNOSIS — Z885 Allergy status to narcotic agent status: Secondary | ICD-10-CM | POA: Insufficient documentation

## 2017-04-17 DIAGNOSIS — M1990 Unspecified osteoarthritis, unspecified site: Secondary | ICD-10-CM | POA: Insufficient documentation

## 2017-04-17 DIAGNOSIS — G4730 Sleep apnea, unspecified: Secondary | ICD-10-CM | POA: Insufficient documentation

## 2017-04-17 DIAGNOSIS — K449 Diaphragmatic hernia without obstruction or gangrene: Secondary | ICD-10-CM | POA: Insufficient documentation

## 2017-04-17 DIAGNOSIS — Z882 Allergy status to sulfonamides status: Secondary | ICD-10-CM | POA: Insufficient documentation

## 2017-04-17 DIAGNOSIS — N138 Other obstructive and reflux uropathy: Secondary | ICD-10-CM | POA: Insufficient documentation

## 2017-04-17 DIAGNOSIS — N401 Enlarged prostate with lower urinary tract symptoms: Secondary | ICD-10-CM | POA: Insufficient documentation

## 2017-04-17 DIAGNOSIS — Z886 Allergy status to analgesic agent status: Secondary | ICD-10-CM | POA: Insufficient documentation

## 2017-04-17 DIAGNOSIS — E785 Hyperlipidemia, unspecified: Secondary | ICD-10-CM | POA: Insufficient documentation

## 2017-04-17 DIAGNOSIS — Z820 Family history of epilepsy and other diseases of the nervous system: Secondary | ICD-10-CM | POA: Insufficient documentation

## 2017-04-17 DIAGNOSIS — Z8673 Personal history of transient ischemic attack (TIA), and cerebral infarction without residual deficits: Secondary | ICD-10-CM | POA: Insufficient documentation

## 2017-04-17 DIAGNOSIS — R351 Nocturia: Secondary | ICD-10-CM | POA: Insufficient documentation

## 2017-04-17 DIAGNOSIS — R319 Hematuria, unspecified: Secondary | ICD-10-CM | POA: Insufficient documentation

## 2017-04-17 DIAGNOSIS — Z7902 Long term (current) use of antithrombotics/antiplatelets: Secondary | ICD-10-CM | POA: Insufficient documentation

## 2017-04-17 DIAGNOSIS — Z823 Family history of stroke: Secondary | ICD-10-CM | POA: Insufficient documentation

## 2017-04-17 DIAGNOSIS — Z803 Family history of malignant neoplasm of breast: Secondary | ICD-10-CM | POA: Insufficient documentation

## 2017-04-17 DIAGNOSIS — E7800 Pure hypercholesterolemia, unspecified: Secondary | ICD-10-CM | POA: Insufficient documentation

## 2017-04-17 DIAGNOSIS — I472 Ventricular tachycardia: Secondary | ICD-10-CM | POA: Insufficient documentation

## 2017-04-17 DIAGNOSIS — I739 Peripheral vascular disease, unspecified: Secondary | ICD-10-CM | POA: Insufficient documentation

## 2017-04-17 DIAGNOSIS — K219 Gastro-esophageal reflux disease without esophagitis: Secondary | ICD-10-CM | POA: Insufficient documentation

## 2017-04-17 DIAGNOSIS — J449 Chronic obstructive pulmonary disease, unspecified: Secondary | ICD-10-CM | POA: Insufficient documentation

## 2017-04-17 HISTORY — DX: Personal history of other diseases of the nervous system and sense organs: Z86.69

## 2017-04-17 HISTORY — DX: Unspecified sequelae of cerebral infarction: I69.30

## 2017-04-17 HISTORY — DX: Personal history of other diseases of the digestive system: Z87.19

## 2017-04-17 HISTORY — DX: Personal history of transient ischemic attack (TIA), and cerebral infarction without residual deficits: Z86.73

## 2017-04-17 HISTORY — DX: Personal history of other medical treatment: Z92.89

## 2017-04-17 HISTORY — DX: Nocturia: R35.1

## 2017-04-17 HISTORY — DX: Personal history of adenomatous and serrated colon polyps: Z86.0101

## 2017-04-17 HISTORY — DX: Diverticulosis of large intestine without perforation or abscess without bleeding: K57.30

## 2017-04-17 HISTORY — DX: Presence of spectacles and contact lenses: Z97.3

## 2017-04-17 HISTORY — DX: Personal history of colonic polyps: Z86.010

## 2017-04-17 HISTORY — DX: Allergic rhinitis, unspecified: J30.9

## 2017-04-17 HISTORY — DX: Cramp and spasm: R25.2

## 2017-04-17 HISTORY — DX: Prediabetes: R73.03

## 2017-04-17 SURGERY — INSERTION, RADIATION SOURCE, PROSTATE
Anesthesia: General

## 2017-04-17 MED ORDER — FLEET ENEMA 7-19 GM/118ML RE ENEM
1.0000 | ENEMA | Freq: Once | RECTAL | Status: DC
  Filled 2017-04-17: qty 1

## 2017-04-17 MED ORDER — CIPROFLOXACIN IN D5W 400 MG/200ML IV SOLN
400.0000 mg | INTRAVENOUS | Status: DC
  Filled 2017-04-17: qty 200

## 2017-04-17 MED ORDER — LACTATED RINGERS IV SOLN
INTRAVENOUS | Status: DC
  Filled 2017-04-17: qty 1000

## 2017-04-17 SURGICAL SUPPLY — 29 items
BAG URINE DRAINAGE (UROLOGICAL SUPPLIES) ×1 IMPLANT
BLADE CLIPPER SURG (BLADE) ×1 IMPLANT
CATH FOLEY 2WAY SLVR  5CC 16FR (CATHETERS)
CATH FOLEY 2WAY SLVR 5CC 16FR (CATHETERS) ×2 IMPLANT
CATH ROBINSON RED A/P 20FR (CATHETERS) ×1 IMPLANT
CLOTH BEACON ORANGE TIMEOUT ST (SAFETY) ×1 IMPLANT
COVER BACK TABLE 60X90IN (DRAPES) ×1 IMPLANT
COVER MAYO STAND STRL (DRAPES) ×1 IMPLANT
DRSG TEGADERM 4X4.75 (GAUZE/BANDAGES/DRESSINGS) ×1 IMPLANT
DRSG TEGADERM 8X12 (GAUZE/BANDAGES/DRESSINGS) ×1 IMPLANT
GLOVE BIO SURGEON STRL SZ7.5 (GLOVE) ×2 IMPLANT
GLOVE ECLIPSE 8.0 STRL XLNG CF (GLOVE) IMPLANT
GOWN STRL REUS W/ TWL LRG LVL3 (GOWN DISPOSABLE) ×1 IMPLANT
GOWN STRL REUS W/ TWL XL LVL3 (GOWN DISPOSABLE) ×1 IMPLANT
GOWN STRL REUS W/TWL LRG LVL3 (GOWN DISPOSABLE)
GOWN STRL REUS W/TWL XL LVL3 (GOWN DISPOSABLE)
HOLDER FOLEY CATH W/STRAP (MISCELLANEOUS) ×1 IMPLANT
IMPL SPACEOAR SYSTEM 10ML (MISCELLANEOUS) IMPLANT
IMPLANT SPACEOAR SYSTEM 10ML (MISCELLANEOUS)
IV NS 1000ML (IV SOLUTION)
IV NS 1000ML BAXH (IV SOLUTION) ×1 IMPLANT
KIT RM TURNOVER CYSTO AR (KITS) ×1 IMPLANT
MANIFOLD NEPTUNE II (INSTRUMENTS) IMPLANT
PACK CYSTO (CUSTOM PROCEDURE TRAY) ×1 IMPLANT
SYRINGE 10CC LL (SYRINGE) ×1 IMPLANT
TUBE CONNECTING 12'X1/4 (SUCTIONS)
TUBE CONNECTING 12X1/4 (SUCTIONS) IMPLANT
UNDERPAD 30X30 INCONTINENT (UNDERPADS AND DIAPERS) ×2 IMPLANT
WATER STERILE IRR 500ML POUR (IV SOLUTION) ×1 IMPLANT

## 2017-04-17 NOTE — Anesthesia Preprocedure Evaluation (Addendum)
Anesthesia Evaluation  Patient identified by MRN, date of birth, ID band Patient awake    Reviewed: Allergy & Precautions, H&P , NPO status , Patient's Chart, lab work & pertinent test results, reviewed documented beta blocker date and time   Airway Mallampati: II  TM Distance: >3 FB Neck ROM: full    Dental  (+) Dental Advisory Given, Teeth Intact   Pulmonary asthma , COPD,  COPD inhaler,    breath sounds clear to auscultation       Cardiovascular hypertension, Pt. on home beta blockers, On Medications and Pt. on medications + Peripheral Vascular Disease  + dysrhythmias (hx svt) Supra Ventricular Tachycardia  Rhythm:regular Rate:Normal  ECG: NSR, rate 62  Hyperlipidemia    Neuro/Psych PSYCHIATRIC DISORDERS Anxiety  Neuromuscular disease CVA (left sided weakness), Residual Symptoms    GI/Hepatic Neg liver ROS, hiatal hernia, GERD  Medicated,  Endo/Other  negative endocrine ROS  Renal/GU Renal disease   PROSTATE CANCER    Musculoskeletal   Abdominal (+)  Abdomen: soft. Bowel sounds: normal.  Peds  Hematology negative hematology ROS (+)   Anesthesia Other Findings   Reproductive/Obstetrics                            Anesthesia Physical  Anesthesia Plan  ASA: III  Anesthesia Plan: General   Post-op Pain Management:    Induction: Intravenous  PONV Risk Score and Plan: Treatment may vary due to age or medical condition  Airway Management Planned: LMA  Additional Equipment:   Intra-op Plan:   Post-operative Plan: Extubation in OR  Informed Consent: I have reviewed the patients History and Physical, chart, labs and discussed the procedure including the risks, benefits and alternatives for the proposed anesthesia with the patient or authorized representative who has indicated his/her understanding and acceptance.   Dental advisory given  Plan Discussed with: Surgeon and  CRNA  Anesthesia Plan Comments:        Anesthesia Quick Evaluation

## 2017-04-17 NOTE — Interval H&P Note (Signed)
History and Physical Interval Note:  04/17/2017 10:29 AM  [name]  has presented today for surgery, with the diagnosis of PROSTATE CANCER  The various methods of treatment have been discussed with the patient and family. After consideration of risks, benefits and other options for treatment, the patient has consented to  Procedure(s): RADIOACTIVE SEED IMPLANT/BRACHYTHERAPY IMPLANT SPACE OAR (N/A) as a surgical intervention, but he took Plavix this morning and we are going to have to CANCEL case for today and reschedule.    Gregory Pham

## 2017-04-17 NOTE — Progress Notes (Signed)
PT SURGERY CANCELLED TODAY BY DR [facility] SINCE PT TOOK HIS PLAVIX THIS MORNING (HE HAD STOPPED TWO DAYS PRIOR TO DOS AND THOUGHT HE WAS TO TAKE ON THIRD DAY).   OFFICE RESCHEDULED PT ON Tuesday 04-21-2017 AT 0730.  SPOKE W/ PT SON, [place], VIA PHONE.  VERBALIZED UNDERSTAINDING NPO AFTER MN.  ARRIVE AT 0600.  CURRENT EKG, CXR, AND LAB RESULTS IN CHART AND EPIC.  PT WILL TAKE ATENOLOL, ACHIPHEX, ZOCOR, AND ADVAIR INHALER AM DOS W/ SIPS OF WATER AND DO FLEET ENEMA.  SON ALSO VERBALIZED WILL MAKE SURE PT DOES NOT TAKE PLAVIX UNTIL AFTER SURGERY.

## 2017-04-17 NOTE — Telephone Encounter (Signed)
CALLED PATIENT TO INFORM THAT IMPLANT HAS BEEN MOVED TO 04-21-17 @ 7:30 AM @ [place], SPOKE WITH PATIENT'S SON TIM AND HE IS AWARE OF THIS PROCEDURE

## 2017-04-17 NOTE — H&P (Signed)
Office Visit Report     04/10/2017   --------------------------------------------------------------------------------   Gregory Pham  MRN: [id number]  PRIMARY CARE:  Gregory Brine, MD  DOB: November 15, 1940, 76 year old Male  REFERRING:  Gregory Azalia Bilis, MD  SSN: [id number]  PROVIDER:  Festus Pham, M.D.    TREATING:  Gregory Pham    LOCATION:  [facility] [phone]   --------------------------------------------------------------------------------   CC/HPI: Patient with positive biopsy for prostate cancer. He is scheduled for radioactive seed placement on 8/31. Here today for preop appointment. Patient denies any changes or worsening voiding symptoms since last office visit. No recent fevers or infections. Denies unexplained weight loss, bone pain, or unexplained fatigue.  ALLERGIES: Azithromycin TABS biaxin Biaxin TABS Cephalexin Clindamycin Darvocet Doxycycline Flexeril Flomax CAPS Hydrocodone-Acetaminophen CAPS hyoscyamine Hyoscyamine TABS LevoFLOXacin TABS meloxicam Naprosyn Oxycodone Hcl Septra TABS Skelaxin sulfa Tramadol Tussionex Pennkinetic ER LQCR Tylenol    MEDICATIONS: Aciphex 20 mg tablet, delayed release  Advair Diskus 100 mcg-50 mcg/dose blister, with inhalation device Inhalation  Atenolol 50 mg tablet Oral  Fluticasone Propionate 50 mcg/actuation spray, suspension Nasal  Hydroxyzine Hcl 10 mg tablet Oral  Melatonin 3 mg tablet  Nitrostat 0.4 mg tablet, sublingual Sublingual  Plavix 75 mg tablet Oral  Ranitidine Hcl 300 mg capsule  Simvastatin 20 mg tablet Oral     GU PSH: Prostate Needle Biopsy - 12/12/2016      PSH Notes: Hernia Repair, Hernia Repair, Appendectomy, Sinus Surgery   NON-GU PSH: Appendectomy - 2012 Hernia Repair - 2012, 2012 Surgical Pathology, Gross And Microscopic Examination For Prostate Needle - 12/12/2016    GU PMH: Prostate Cancer - 01/02/2017 Elevated PSA - 12/12/2016, - 10/07/2016 Hematospermia,  Hematospermia - 2014 Nocturia, Nocturia - 2014 Obstructive and reflux uropathy, Unspec, Obstructive uropathy - 2014 Other microscopic hematuria, Microscopic Hematuria - 2014 Renal cyst, Renal cyst, acquired, right - 2014      PMH Notes:  1898-08-18 00:00:00 - Note: Normal Routine History And Physical Senior Citizen [phone])  2007-03-23 10:57:56 - Note: Arthritis   NON-GU PMH: Asthma, Asthma - 2014 COPD, Chronic Obstructive Pulmonary Disease - 2014 Personal history of other endocrine, nutritional and metabolic disease, History of hypercholesterolemia - 2014 Personal history of other specified conditions, History of heartburn - 2014 Personal history of transient ischemic attack (TIA), and cerebral infarction without residual deficits, History of transient cerebral ischemia - 2014 GERD Hypercholesterolemia Sleep Apnea Stroke/TIA    FAMILY HISTORY: Breast Cancer - Sister Death In The Family Father - Father Death In The Family Mother - Mother Family Health Status Number - Runs In Family Hemorrhage - Mother Parkinson's Disease - Father Prostate Cancer - Brother, Brother Stroke Syndrome - Mother   SOCIAL HISTORY: Marital Status: Married Preferred Language: English; Race: White Current Smoking Status: Patient has never smoked.   Tobacco Use Assessment Completed: Used Tobacco in last 30 days? Drinks 1 caffeinated drink per day. Patient's occupation is/was retired.     Notes: 1 son    REVIEW OF SYSTEMS:    GU Review Male:   Patient reports get up at night to urinate. Patient denies frequent urination, hard to postpone urination, burning/ pain with urination, leakage of urine, stream starts and stops, trouble starting your stream, have to strain to urinate , erection problems, and penile pain.  Gastrointestinal (Upper):   Patient denies nausea, vomiting, and indigestion/ heartburn.  Gastrointestinal (Lower):   Patient denies diarrhea and constipation.  Constitutional:   Patient  denies fever, night sweats, weight  loss, and fatigue.  Skin:   Patient denies skin rash/ lesion and itching.  Eyes:   Patient denies blurred vision and double vision.  Ears/ Nose/ Throat:   Patient denies sore throat and sinus problems.  Hematologic/Lymphatic:   Patient denies swollen glands and easy bruising.  Cardiovascular:   Patient denies chest pains and leg swelling.  Respiratory:   Patient denies cough and shortness of breath.  Endocrine:   Patient denies excessive thirst.  Musculoskeletal:   Patient denies back pain and joint pain.  Neurological:   Patient denies headaches and dizziness.  Psychologic:   Patient denies depression and anxiety.   VITAL SIGNS:      04/10/2017 10:01 AM  Weight 170 lb / 77.11 kg  Height 69 in / 175.26 cm  BP 132/74 mmHg  Pulse 63 /min  Temperature 97.4 F / 36.3 C  BMI 25.1 kg/m   MULTI-SYSTEM PHYSICAL EXAMINATION:    Constitutional: Well-nourished. No physical deformities. Normally developed. Good grooming.  Respiratory: No labored breathing, no use of accessory muscles. CTA.  Cardiovascular: Normal temperature, normal extremity pulses, no swelling, no varicosities. RRR.  Skin: No paleness, no jaundice, no cyanosis. No lesion, no ulcer, no rash.  Neurologic / Psychiatric: Oriented to time, oriented to place, oriented to person. No depression, no anxiety, no agitation.  Gastrointestinal: No mass, no tenderness, no rigidity, non obese abdomen. NO CVAT. No flank or suprapubic tenderness.     PAST DATA REVIEWED:  Source Of History:  Patient, Family/Caregiver  Lab Test Review:   PSA  Records Review:   Pathology Reports, Previous Patient Records  Urine Test Review:   Urinalysis   10/30/16 03/08/07  PSA  Total PSA 6.51 ng/dl 0.39   Free PSA 1.09 ng/dl   % Free PSA 17 %     04/10/17  Urinalysis  Urine Appearance Clear   Urine Color Amber   Urine Glucose Neg   Urine Bilirubin Neg   Urine Ketones Neg   Urine Specific Gravity 1.025   Urine  Blood Trace   Urine pH 5.5   Urine Protein Trace   Urine Urobilinogen 0.2   Urine Nitrites Neg   Urine Leukocyte Esterase Neg   Urine WBC/hpf NS (Not Seen)   Urine RBC/hpf 0 - 2/hpf   Urine Epithelial Cells NS (Not Seen)   Urine Bacteria NS (Not Seen)   Urine Mucous Present   Urine Yeast NS (Not Seen)   Urine Trichomonas Not Present   Urine Cystals Ca Oxalate   Urine Casts NS (Not Seen)   Urine Sperm Not Present    PROCEDURES:          Urinalysis Gregory/Scope Dipstick Dipstick Cont'd Micro  Color: Amber Bilirubin: Neg WBC/hpf: NS (Not Seen)  Appearance: Clear Ketones: Neg RBC/hpf: 0 - 2/hpf  Specific Gravity: 1.025 Blood: Trace Bacteria: NS (Not Seen)  pH: 5.5 Protein: Trace Cystals: Ca Oxalate  Glucose: Neg Urobilinogen: 0.2 Casts: NS (Not Seen)    Nitrites: Neg Trichomonas: Not Present    Leukocyte Esterase: Neg Mucous: Present      Epithelial Cells: NS (Not Seen)      Yeast: NS (Not Seen)      Sperm: Not Present    ASSESSMENT:      ICD-10 Details  1 GU:   Prostate Cancer - C61   2   BPH Gregory/LUTS - N40.1    PLAN:           Orders Labs Urine Culture  Schedule Return Visit/Planned Activity: Keep Scheduled Appointment - Schedule Surgery          Document Letter(s):  Created for Patient: Clinical Summary         Notes:   Tentatively scheduled for radioactive seed placement next week. No concerning signs or symptoms for underlying infection. PE with no concerning abnormality. Answered all questions to the best of my ability with understanding expressed by the patient and his family. Urinalysis today sent for culture to serve as baseline.    * Signed by Gregory Pham on 04/10/17 at 10:30 AM (EDT)*     The information contained in this medical record document is considered private and confidential patient information. This information can only be used for the medical diagnosis and/or medical services that are being provided by the patient's selected caregivers.  This information can only be distributed outside of the patient's care if the patient agrees and signs waivers of authorization for this information to be sent to an outside source or route.  Addendum: Apr 2018 Intermediate Risk PCa  T1c  Prostate 19 g  Gleason 4+3=7, two cores, 30-40%, left mid  Gleason 3+3=6, 10 %, right apex  Atypia x 2  PSAD 0.36, PSAV 1.2 / year

## 2017-04-21 ENCOUNTER — Ambulatory Visit (HOSPITAL_BASED_OUTPATIENT_CLINIC_OR_DEPARTMENT_OTHER)
Admission: RE | Admit: 2017-04-21 | Discharge: 2017-04-21 | Disposition: A | Discharge status: Home / Self Care | Payer: Medicare Other | Source: Ambulatory Visit | Attending: Urology | Admitting: Urology

## 2017-04-21 ENCOUNTER — Ambulatory Visit (HOSPITAL_BASED_OUTPATIENT_CLINIC_OR_DEPARTMENT_OTHER): Payer: Medicare Other | Admitting: Anesthesiology

## 2017-04-21 ENCOUNTER — Ambulatory Visit (HOSPITAL_COMMUNITY): Payer: Medicare Other

## 2017-04-21 ENCOUNTER — Encounter (HOSPITAL_BASED_OUTPATIENT_CLINIC_OR_DEPARTMENT_OTHER)
Admission: RE | Disposition: A | Discharge status: Home / Self Care | Payer: Self-pay | Source: Ambulatory Visit | Attending: Urology

## 2017-04-21 ENCOUNTER — Encounter (HOSPITAL_BASED_OUTPATIENT_CLINIC_OR_DEPARTMENT_OTHER): Payer: Self-pay | Admitting: *Deleted

## 2017-04-21 DIAGNOSIS — J449 Chronic obstructive pulmonary disease, unspecified: Secondary | ICD-10-CM | POA: Diagnosis not present

## 2017-04-21 DIAGNOSIS — G4730 Sleep apnea, unspecified: Secondary | ICD-10-CM | POA: Insufficient documentation

## 2017-04-21 DIAGNOSIS — R351 Nocturia: Secondary | ICD-10-CM | POA: Insufficient documentation

## 2017-04-21 DIAGNOSIS — Z8673 Personal history of transient ischemic attack (TIA), and cerebral infarction without residual deficits: Secondary | ICD-10-CM | POA: Diagnosis not present

## 2017-04-21 DIAGNOSIS — Z885 Allergy status to narcotic agent status: Secondary | ICD-10-CM | POA: Diagnosis not present

## 2017-04-21 DIAGNOSIS — C61 Malignant neoplasm of prostate: Secondary | ICD-10-CM | POA: Insufficient documentation

## 2017-04-21 DIAGNOSIS — Z79899 Other long term (current) drug therapy: Secondary | ICD-10-CM | POA: Diagnosis not present

## 2017-04-21 DIAGNOSIS — N139 Obstructive and reflux uropathy, unspecified: Secondary | ICD-10-CM | POA: Diagnosis not present

## 2017-04-21 DIAGNOSIS — Z7902 Long term (current) use of antithrombotics/antiplatelets: Secondary | ICD-10-CM | POA: Diagnosis not present

## 2017-04-21 DIAGNOSIS — Z881 Allergy status to other antibiotic agents status: Secondary | ICD-10-CM | POA: Diagnosis not present

## 2017-04-21 DIAGNOSIS — K219 Gastro-esophageal reflux disease without esophagitis: Secondary | ICD-10-CM | POA: Insufficient documentation

## 2017-04-21 DIAGNOSIS — E7800 Pure hypercholesterolemia, unspecified: Secondary | ICD-10-CM | POA: Diagnosis not present

## 2017-04-21 DIAGNOSIS — Z886 Allergy status to analgesic agent status: Secondary | ICD-10-CM | POA: Diagnosis not present

## 2017-04-21 DIAGNOSIS — N401 Enlarged prostate with lower urinary tract symptoms: Secondary | ICD-10-CM | POA: Insufficient documentation

## 2017-04-21 HISTORY — PX: CYSTOSCOPY: SHX5120

## 2017-04-21 HISTORY — PX: RADIOACTIVE SEED IMPLANT: SHX5150

## 2017-04-21 HISTORY — PX: SPACE OAR INSTILLATION: SHX6769

## 2017-04-21 SURGERY — INSERTION, RADIATION SOURCE, PROSTATE
Anesthesia: General | Site: Prostate

## 2017-04-21 MED ORDER — ONDANSETRON HCL 4 MG/2ML IJ SOLN
INTRAMUSCULAR | Status: AC
  Filled 2017-04-21: qty 2

## 2017-04-21 MED ORDER — CIPROFLOXACIN IN D5W 400 MG/200ML IV SOLN
400.0000 mg | INTRAVENOUS | Status: AC
  Administered 2017-04-21: 400 mg via INTRAVENOUS
  Filled 2017-04-21: qty 200

## 2017-04-21 MED ORDER — CIPROFLOXACIN IN D5W 400 MG/200ML IV SOLN
INTRAVENOUS | Status: AC
  Filled 2017-04-21: qty 200

## 2017-04-21 MED ORDER — FLEET ENEMA 7-19 GM/118ML RE ENEM
1.0000 | ENEMA | Freq: Once | RECTAL | Status: AC
  Administered 2017-04-21: 1 via RECTAL
  Filled 2017-04-21: qty 1

## 2017-04-21 MED ORDER — IOHEXOL 300 MG/ML  SOLN
INTRAMUSCULAR | Status: DC | PRN
  Administered 2017-04-21: 7 mL

## 2017-04-21 MED ORDER — PROPOFOL 10 MG/ML IV BOLUS
INTRAVENOUS | Status: DC | PRN
  Administered 2017-04-21: 150 mg via INTRAVENOUS

## 2017-04-21 MED ORDER — FENTANYL CITRATE (PF) 100 MCG/2ML IJ SOLN
INTRAMUSCULAR | Status: DC | PRN
  Administered 2017-04-21: 25 ug via INTRAVENOUS
  Administered 2017-04-21: 50 ug via INTRAVENOUS

## 2017-04-21 MED ORDER — ACETAMINOPHEN 500 MG PO TABS
ORAL_TABLET | ORAL | Status: AC
  Filled 2017-04-21: qty 2

## 2017-04-21 MED ORDER — CLOPIDOGREL BISULFATE 75 MG PO TABS: 75.0000 mg | ORAL_TABLET | Freq: Every morning | ORAL | Status: AC

## 2017-04-21 MED ORDER — PHENYLEPHRINE HCL 10 MG/ML IJ SOLN
INTRAVENOUS | Status: DC | PRN
  Administered 2017-04-21: 50 ug/min via INTRAVENOUS

## 2017-04-21 MED ORDER — LIDOCAINE 2% (20 MG/ML) 5 ML SYRINGE
INTRAMUSCULAR | Status: AC
  Filled 2017-04-21: qty 5

## 2017-04-21 MED ORDER — SODIUM CHLORIDE 0.9 % IR SOLN
Status: DC | PRN
  Administered 2017-04-21: 1000 mL

## 2017-04-21 MED ORDER — SODIUM CHLORIDE 0.9 % IJ SOLN
INTRAMUSCULAR | Status: DC | PRN
  Administered 2017-04-21: 10 mL

## 2017-04-21 MED ORDER — DEXAMETHASONE SODIUM PHOSPHATE 10 MG/ML IJ SOLN
INTRAMUSCULAR | Status: DC | PRN
  Administered 2017-04-21: 10 mg via INTRAVENOUS

## 2017-04-21 MED ORDER — LACTATED RINGERS IV SOLN
INTRAVENOUS | Status: DC
  Administered 2017-04-21 (×2): via INTRAVENOUS
  Filled 2017-04-21: qty 1000

## 2017-04-21 MED ORDER — KETOROLAC TROMETHAMINE 30 MG/ML IJ SOLN
INTRAMUSCULAR | Status: AC
  Filled 2017-04-21: qty 1

## 2017-04-21 MED ORDER — LIDOCAINE 2% (20 MG/ML) 5 ML SYRINGE
INTRAMUSCULAR | Status: DC | PRN
  Administered 2017-04-21: 80 mg via INTRAVENOUS

## 2017-04-21 MED ORDER — HYDROMORPHONE HCL 1 MG/ML IJ SOLN
0.2500 mg | INTRAMUSCULAR | Status: DC | PRN
  Filled 2017-04-21: qty 0.5

## 2017-04-21 MED ORDER — MEPERIDINE HCL 25 MG/ML IJ SOLN
6.2500 mg | INTRAMUSCULAR | Status: DC | PRN
  Filled 2017-04-21: qty 1

## 2017-04-21 MED ORDER — ONDANSETRON HCL 4 MG/2ML IJ SOLN
INTRAMUSCULAR | Status: DC | PRN
  Administered 2017-04-21: 4 mg via INTRAVENOUS

## 2017-04-21 MED ORDER — ACETAMINOPHEN 500 MG PO TABS
1000.0000 mg | ORAL_TABLET | Freq: Once | ORAL | Status: AC
  Administered 2017-04-21: 1000 mg via ORAL
  Filled 2017-04-21: qty 2

## 2017-04-21 MED ORDER — ONDANSETRON HCL 4 MG/2ML IJ SOLN
4.0000 mg | Freq: Once | INTRAMUSCULAR | Status: DC | PRN
  Filled 2017-04-21: qty 2

## 2017-04-21 MED ORDER — FENTANYL CITRATE (PF) 100 MCG/2ML IJ SOLN
INTRAMUSCULAR | Status: AC
  Filled 2017-04-21: qty 2

## 2017-04-21 MED ORDER — PROPOFOL 10 MG/ML IV BOLUS
INTRAVENOUS | Status: AC
  Filled 2017-04-21: qty 40

## 2017-04-21 MED ORDER — DEXAMETHASONE SODIUM PHOSPHATE 10 MG/ML IJ SOLN
INTRAMUSCULAR | Status: AC
  Filled 2017-04-21: qty 1

## 2017-04-21 SURGICAL SUPPLY — 36 items
BAG URINE DRAINAGE (UROLOGICAL SUPPLIES) ×4 IMPLANT
BLADE CLIPPER SURG (BLADE) ×4 IMPLANT
CATH FOLEY 2WAY SLVR  5CC 16FR (CATHETERS) ×4
CATH FOLEY 2WAY SLVR 5CC 16FR (CATHETERS) ×4 IMPLANT
CATH ROBINSON RED A/P 20FR (CATHETERS) ×4 IMPLANT
CLOTH BEACON ORANGE TIMEOUT ST (SAFETY) ×4 IMPLANT
COVER BACK TABLE 60X90IN (DRAPES) ×4 IMPLANT
COVER MAYO STAND STRL (DRAPES) ×4 IMPLANT
DRSG TEGADERM 4X4.75 (GAUZE/BANDAGES/DRESSINGS) ×4 IMPLANT
DRSG TEGADERM 8X12 (GAUZE/BANDAGES/DRESSINGS) ×4 IMPLANT
GAUZE SPONGE 4X4 12PLY STRL (GAUZE/BANDAGES/DRESSINGS) ×2 IMPLANT
GLOVE BIO SURGEON STRL SZ7.5 (GLOVE) ×8 IMPLANT
GLOVE BIOGEL PI IND STRL 7.0 (GLOVE) IMPLANT
GLOVE BIOGEL PI IND STRL 7.5 (GLOVE) IMPLANT
GLOVE BIOGEL PI INDICATOR 7.0 (GLOVE) ×4
GLOVE BIOGEL PI INDICATOR 7.5 (GLOVE) ×2
GLOVE ECLIPSE 7.0 STRL STRAW (GLOVE) ×2 IMPLANT
GLOVE ECLIPSE 8.0 STRL XLNG CF (GLOVE) ×10 IMPLANT
GOWN STRL REUS W/ TWL LRG LVL3 (GOWN DISPOSABLE) ×2 IMPLANT
GOWN STRL REUS W/ TWL XL LVL3 (GOWN DISPOSABLE) ×2 IMPLANT
GOWN STRL REUS W/TWL LRG LVL3 (GOWN DISPOSABLE)
GOWN STRL REUS W/TWL XL LVL3 (GOWN DISPOSABLE) ×4 IMPLANT
HOLDER FOLEY CATH W/STRAP (MISCELLANEOUS) ×2 IMPLANT
IMPL SPACEOAR SYSTEM 10ML (MISCELLANEOUS) IMPLANT
IMPLANT SPACEOAR SYSTEM 10ML (MISCELLANEOUS) ×4
IV NS 1000ML (IV SOLUTION) ×4
IV NS 1000ML BAXH (IV SOLUTION) ×2 IMPLANT
KIT RM TURNOVER CYSTO AR (KITS) ×4 IMPLANT
MANIFOLD NEPTUNE II (INSTRUMENTS) IMPLANT
NUCLETRON SELECT SEED I-125 ×140 IMPLANT
PACK CYSTO (CUSTOM PROCEDURE TRAY) ×4 IMPLANT
SYRINGE 10CC LL (SYRINGE) ×4 IMPLANT
TUBE CONNECTING 12'X1/4 (SUCTIONS)
TUBE CONNECTING 12X1/4 (SUCTIONS) IMPLANT
UNDERPAD 30X30 INCONTINENT (UNDERPADS AND DIAPERS) ×8 IMPLANT
WATER STERILE IRR 500ML POUR (IV SOLUTION) ×4 IMPLANT

## 2017-04-21 NOTE — Anesthesia Postprocedure Evaluation (Signed)
Anesthesia Post Note  Patient: Gregory Pham  Procedure(s) Performed: Procedure(s) (LRB): RADIOACTIVE SEED IMPLANT/BRACHYTHERAPY IMPLANT (N/A) SPACE OAR INSTILLATION (N/A) CYSTOSCOPY FLEXIBLE (N/A)     Patient location during evaluation: PACU Anesthesia Type: General Level of consciousness: awake and alert Pain management: pain level controlled Vital Signs Assessment: post-procedure vital signs reviewed and stable Respiratory status: spontaneous breathing, nonlabored ventilation, respiratory function stable and patient connected to nasal cannula oxygen Cardiovascular status: blood pressure returned to baseline and stable Postop Assessment: no signs of nausea or vomiting Anesthetic complications: no    Last Vitals:  Vitals:   04/21/17 1017 04/21/17 1239  BP: 125/71 (!) 147/77  Pulse: 70 (!) 57  Resp: 13 14  Temp:  36.7 C  SpO2: 96% 100%    Last Pain:  Vitals:   04/21/17 0605  TempSrc: Oral                 Keilee Denman DAVID

## 2017-04-21 NOTE — Interval H&P Note (Signed)
History and Physical Interval Note:  04/21/2017 7:31 AM  Gregory Pham  has presented today for surgery, with the diagnosis of PROSTATE CANCER  The various methods of treatment have been discussed with the patient and family. I discussed with the patient and family the nature, potential benefits, risks and alternatives to Radioactive seed implantation/brachytherapy implant/Spaceoar implant, cystoscopy, including side effects of the proposed treatment, the likelihood of the patient achieving the goals of the procedure, and any potential problems that might occur during the procedure or recuperation. After consideration of risks, benefits and other options for treatment, the patient has consented to the above procedures as a surgical intervention and elects to proceed.  The patient's history has been reviewed, patient examined, no change in status, stable for surgery.  I have reviewed the patient's chart and labs.  Questions were answered to the patient's and family's satisfaction.     Jaylen Claude

## 2017-04-21 NOTE — Discharge Instructions (Signed)
Brachytherapy for Prostate Cancer, Care After Refer to this sheet in the next few weeks. These instructions provide you with information on caring for yourself after your procedure. Your health care provider may also give you more specific instructions. Your treatment has been planned according to current medical practices, but problems sometimes occur. Call your health care provider if you have any problems or questions after your procedure. What can I expect after the procedure? The area behind the scrotum will probably be tender and bruised. For a short period of time you may have:  Difficulty passing urine. You may need a catheter for a few days to a month.  Blood in the urine or semen.  A feeling of constipation because of prostate swelling.  Frequent feeling of an urgent need to urinate.  For a long period of time you may have:  Inflammation of the rectum. This happens in about 2% of people who have the procedure.  Erection problems. These vary with age and occur in about 15-40% of men.  Difficulty urinating. This is caused by scarring in the urethra.  Diarrhea.  Follow these instructions at home:  Take medicines only as directed by your health care provider.  You will probably have a catheter in your bladder for several days. You will have blood in the urine bag and should drink a lot of fluids to keep it a light red color.  Keep all follow-up visits as directed by your health care provider. If you have a catheter, it will be removed during one of these visits.  Try not to sit directly on the area behind the scrotum. A soft cushion can decrease the discomfort. Ice packs may also be helpful for the discomfort. Do not put ice directly on the skin.  Shower and wash the area behind the scrotum gently. Do not sit in a tub.  If you have had the brachytherapy that uses the seeds, limit your close contact with children and pregnant women for 2 months because of the radiation still  in the prostate. After that period of time, the levels drop off quickly. Get help right away if:  You have a fever.  You have chills.  You have shortness of breath.  You have chest pain.  You have thick blood, like tomato juice, when you urinate.   You have trouble urinating   There is excessive bleeding from your rectum. It is normal to have a little blood mixed with your stool.  There is severe discomfort in the treated area that does not go away with pain medicine.  You have abdominal discomfort.  You have severe nausea or vomiting.  You develop any new or unusual symptoms. This information is not intended to replace advice given to you by your health care provider. Make sure you discuss any questions you have with your health care provider. Document Released: 09/06/2010 Document Revised: 01/16/2016 Document Reviewed: 01/25/2013 Elsevier Interactive Patient Education  2017 [place] [facility] Care Instructions  Activity: Get plenty of rest for the remainder of the day. A responsible individual must stay with you for 24 hours following the procedure.  For the next 24 hours, DO NOT: -Drive a car -Paediatric nurse -Drink alcoholic beverages -Take any medication unless instructed by your physician -Make any legal decisions or sign important papers.  Meals: Start with liquid foods such as gelatin or soup. Progress to regular foods as tolerated. Avoid greasy, spicy, heavy foods. If nausea and/or vomiting occur, drink only  clear liquids until the nausea and/or vomiting subsides. Call your physician if vomiting continues.  Special Instructions/Symptoms: Your throat may feel dry or sore from the anesthesia or the breathing tube placed in your throat during surgery. If this causes discomfort, gargle with warm salt water. The discomfort should disappear within 24 hours.  If you had a scopolamine patch placed behind your ear for the management of post- operative  nausea and/or vomiting:  1. The medication in the patch is effective for 72 hours, after which it should be removed.  Wrap patch in a tissue and discard in the trash. Wash hands thoroughly with soap and water. 2. You may remove the patch earlier than 72 hours if you experience unpleasant side effects which may include dry mouth, dizziness or visual disturbances. 3. Avoid touching the patch. Wash your hands with soap and water after contact with the patch.

## 2017-04-21 NOTE — Anesthesia Procedure Notes (Signed)
Procedure Name: LMA Insertion Date/Time: 04/21/2017 8:03 AM Performed by: Bethena Roys T Pre-anesthesia Checklist: Patient identified, Emergency Drugs available, Suction available and Patient being monitored Patient Re-evaluated:Patient Re-evaluated prior to induction Oxygen Delivery Method: Circle system utilized Preoxygenation: Pre-oxygenation with 100% oxygen Induction Type: IV induction Ventilation: Mask ventilation without difficulty LMA: LMA inserted LMA Size: 5.0 Number of attempts: 1 Airway Equipment and Method: Bite block Placement Confirmation: positive ETCO2 Tube secured with: Tape Dental Injury: Teeth and Oropharynx as per pre-operative assessment

## 2017-04-21 NOTE — Op Note (Signed)
Preoperative diagnosis: Stage I (T1cNxMx) Prostate cancer Postoperative diagnosis: Same  Procedure:  1)Prostate brachytherapy seed implant 2) Transperineal placement of biodegradable material, peri-prostatic, single or multiple injection(s), including image guidance, when performed - SpaceOar 3) Cystoscopy  Surgeon: Junious Silk  Radiation oncologist: Tammi Klippel  Anesthesia: Gen.  Indication for procedure: 76 year old with stage I prostate cancer who elected to proceed with prostate brachytherapy (PSA 6.51, T1c, G 4+3 and 3+3).  Findings: On fluoroscopic imaging there was adequate coverage of the prostate. On cystoscopy the urethra appeared normal, the prostatic urethra appeared normal, the trigone and ureteral orifices appeared normal with clear efflux. The bladder mucosa appeared normal. There were no stones, foreign bodies or seeds visualized in the bladder or urethra.  Dose: 145 Gy Catheters: 23 Active sources/sedds: 70  Description of procedure: After consent was obtained patient brought to the operating room. After adequate anesthesia he is placed in lithotomy position and the transrectal ultrasound probe and perineal template positioned. Catheters and brachytherapy seeds were placed per Dr. Johny Shears plan. A total of 23 catheters and 70 active sources (I-125) were placed. The anchoring needles, template and ultrasound were removed. Scout flouro imaging was obtained of the implant. The Foley was removed. A transperineal needle was advanced under US guidance mid-gland between denonvilliers'  fascia and the rectal wall. [place] and saline flushed confirmed proper placement in sagittal and axial view. The SpaceOar implant was dispensed under US guidance. The patient was prepped again and cystoscopy was performed which was noted to be normal. The bladder was drained. He was awakened taken to the recovery room in stable condition.  Complications: None  Blood loss: Minimal  Specimens:  None  Drains: none  Disposition: Patient stable to PACU.

## 2017-04-21 NOTE — H&P (View-Only) (Signed)
Office Visit Report     04/10/2017   --------------------------------------------------------------------------------   Gregory Pham  MRN: [id number]  PRIMARY CARE:  Minette Brine, MD  DOB: [birth year], 76 year old Male  REFERRING:  W Azalia Bilis, MD  SSN: -[id number]  PROVIDER:  Festus Aloe, M.D.    TREATING:  Jiles Crocker    LOCATION:  [facility] [phone]   --------------------------------------------------------------------------------   CC/HPI: Patient with positive biopsy for prostate cancer. He is scheduled for radioactive seed placement on 8/31. Here today for preop appointment. Patient denies any changes or worsening voiding symptoms since last office visit. No recent fevers or infections. Denies unexplained weight loss, bone pain, or unexplained fatigue.  ALLERGIES: Azithromycin TABS biaxin Biaxin TABS Cephalexin Clindamycin Darvocet Doxycycline Flexeril Flomax CAPS Hydrocodone-Acetaminophen CAPS hyoscyamine Hyoscyamine TABS LevoFLOXacin TABS meloxicam Naprosyn Oxycodone Hcl Septra TABS Skelaxin sulfa Tramadol Tussionex Pennkinetic ER LQCR Tylenol    MEDICATIONS: Aciphex 20 mg tablet, delayed release  Advair Diskus 100 mcg-50 mcg/dose blister, with inhalation device Inhalation  Atenolol 50 mg tablet Oral  Fluticasone Propionate 50 mcg/actuation spray, suspension Nasal  Hydroxyzine Hcl 10 mg tablet Oral  Melatonin 3 mg tablet  Nitrostat 0.4 mg tablet, sublingual Sublingual  Plavix 75 mg tablet Oral  Ranitidine Hcl 300 mg capsule  Simvastatin 20 mg tablet Oral     GU PSH: Prostate Needle Biopsy - 12/12/2016      PSH Notes: Hernia Repair, Hernia Repair, Appendectomy, Sinus Surgery   NON-GU PSH: Appendectomy - 2012 Hernia Repair - 2012, 2012 Surgical Pathology, Gross And Microscopic Examination For Prostate Needle - 12/12/2016    GU PMH: Prostate Cancer - 01/02/2017 Elevated PSA - 12/12/2016, - 10/07/2016 Hematospermia,  Hematospermia - 2014 Nocturia, Nocturia - 2014 Obstructive and reflux uropathy, Unspec, Obstructive uropathy - 2014 Other microscopic hematuria, Microscopic Hematuria - 2014 Renal cyst, Renal cyst, acquired, right - 2014      PMH Notes:  1898-08-18 00:00:00 - Note: Normal Routine History And Physical Senior Citizen [phone])  2007-03-23 10:57:56 - Note: Arthritis   NON-GU PMH: Asthma, Asthma - 2014 COPD, Chronic Obstructive Pulmonary Disease - 2014 Personal history of other endocrine, nutritional and metabolic disease, History of hypercholesterolemia - 2014 Personal history of other specified conditions, History of heartburn - 2014 Personal history of transient ischemic attack (TIA), and cerebral infarction without residual deficits, History of transient cerebral ischemia - 2014 GERD Hypercholesterolemia Sleep Apnea Stroke/TIA    FAMILY HISTORY: Breast Cancer - Sister Death In The Family Father - Father Death In The Family Mother - Mother Family Health Status Number - Runs In Family Hemorrhage - Mother Parkinson's Disease - Father Prostate Cancer - Brother, Brother Stroke Syndrome - Mother   SOCIAL HISTORY: Marital Status: Married Preferred Language: English; Race: White Current Smoking Status: Patient has never smoked.   Tobacco Use Assessment Completed: Used Tobacco in last 30 days? Drinks 1 caffeinated drink per day. Patient's occupation is/was retired.     Notes: 1 son    REVIEW OF SYSTEMS:    GU Review Male:   Patient reports get up at night to urinate. Patient denies frequent urination, hard to postpone urination, burning/ pain with urination, leakage of urine, stream starts and stops, trouble starting your stream, have to strain to urinate , erection problems, and penile pain.  Gastrointestinal (Upper):   Patient denies nausea, vomiting, and indigestion/ heartburn.  Gastrointestinal (Lower):   Patient denies diarrhea and constipation.  Constitutional:   Patient  denies fever, night sweats, weight  loss, and fatigue.  Skin:   Patient denies skin rash/ lesion and itching.  Eyes:   Patient denies blurred vision and double vision.  Ears/ Nose/ Throat:   Patient denies sore throat and sinus problems.  Hematologic/Lymphatic:   Patient denies swollen glands and easy bruising.  Cardiovascular:   Patient denies chest pains and leg swelling.  Respiratory:   Patient denies cough and shortness of breath.  Endocrine:   Patient denies excessive thirst.  Musculoskeletal:   Patient denies back pain and joint pain.  Neurological:   Patient denies headaches and dizziness.  Psychologic:   Patient denies depression and anxiety.   VITAL SIGNS:      04/10/2017 10:01 AM  Weight 170 lb / 77.11 kg  Height 69 in / 175.26 cm  BP 132/74 mmHg  Pulse 63 /min  Temperature 97.4 F / 36.3 C  BMI 25.1 kg/m   MULTI-SYSTEM PHYSICAL EXAMINATION:    Constitutional: Well-nourished. No physical deformities. Normally developed. Good grooming.  Respiratory: No labored breathing, no use of accessory muscles. CTA.  Cardiovascular: Normal temperature, normal extremity pulses, no swelling, no varicosities. RRR.  Skin: No paleness, no jaundice, no cyanosis. No lesion, no ulcer, no rash.  Neurologic / Psychiatric: Oriented to time, oriented to place, oriented to person. No depression, no anxiety, no agitation.  Gastrointestinal: No mass, no tenderness, no rigidity, non obese abdomen. NO CVAT. No flank or suprapubic tenderness.     PAST DATA REVIEWED:  Source Of History:  Patient, Family/Caregiver  Lab Test Review:   PSA  Records Review:   Pathology Reports, Previous Patient Records  Urine Test Review:   Urinalysis   10/30/16 03/08/07  PSA  Total PSA 6.51 ng/dl 0.39   Free PSA 1.09 ng/dl   % Free PSA 17 %     04/10/17  Urinalysis  Urine Appearance Clear   Urine Color Amber   Urine Glucose Neg   Urine Bilirubin Neg   Urine Ketones Neg   Urine Specific Gravity 1.025   Urine  Blood Trace   Urine pH 5.5   Urine Protein Trace   Urine Urobilinogen 0.2   Urine Nitrites Neg   Urine Leukocyte Esterase Neg   Urine WBC/hpf NS (Not Seen)   Urine RBC/hpf 0 - 2/hpf   Urine Epithelial Cells NS (Not Seen)   Urine Bacteria NS (Not Seen)   Urine Mucous Present   Urine Yeast NS (Not Seen)   Urine Trichomonas Not Present   Urine Cystals Ca Oxalate   Urine Casts NS (Not Seen)   Urine Sperm Not Present    PROCEDURES:          Urinalysis w/Scope Dipstick Dipstick Cont'd Micro  Color: Amber Bilirubin: Neg WBC/hpf: NS (Not Seen)  Appearance: Clear Ketones: Neg RBC/hpf: 0 - 2/hpf  Specific Gravity: 1.025 Blood: Trace Bacteria: NS (Not Seen)  pH: 5.5 Protein: Trace Cystals: Ca Oxalate  Glucose: Neg Urobilinogen: 0.2 Casts: NS (Not Seen)    Nitrites: Neg Trichomonas: Not Present    Leukocyte Esterase: Neg Mucous: Present      Epithelial Cells: NS (Not Seen)      Yeast: NS (Not Seen)      Sperm: Not Present    ASSESSMENT:      ICD-10 Details  1 GU:   Prostate Cancer - C61   2   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 No concerning signs or symptoms for underlying infection. PE with no concerning abnormality. Answered all questions to the best of my ability with understanding expressed by the patient and his family. Urinalysis today sent for culture to serve as baseline.    * Signed by Jiles Crocker on 04/10/17 at 10:30 AM (EDT)*     The information contained in this medical record document is considered private and confidential patient information. This information can only be used for the medical diagnosis and/or medical services that are being provided by the patient's selected caregivers.  This information can only be distributed outside of the patient's care if the patient agrees and signs waivers of authorization for this information to be sent to an outside source or route.  Addendum: Apr 2018 Intermediate Risk PCa  T1c  Prostate 19 g  Gleason 4+3=7, two cores, 30-40%, left mid  Gleason 3+3=6, 10 %, right apex  Atypia x 2  PSAD 0.36, PSAV 1.2 / year

## 2017-04-21 NOTE — Transfer of Care (Signed)
Immediate Anesthesia Transfer of Care Note  Patient: NETANEL YANNUZZI  Procedure(s) Performed: Procedure(s) with comments: RADIOACTIVE SEED IMPLANT/BRACHYTHERAPY IMPLANT (N/A) -  18  SEEDS IMPLANTED SPACE OAR INSTILLATION (N/A) CYSTOSCOPY FLEXIBLE (N/A) - NO SEEDS FOUND IN BLADDER  Patient Location: PACU  Anesthesia Type:General  Level of Consciousness: drowsy  Airway & Oxygen Therapy: Patient Spontanous Breathing and Patient connected to nasal cannula oxygen  Post-op Assessment: Report given to RN  Post vital signs: Reviewed and stable  Last Vitals: 128/72, 67, 12, 100%, 98.3 Vitals:   04/21/17 0605  BP: 126/72  Pulse: 61  Resp: 16  Temp: (!) 36.4 C  SpO2: 97%    Last Pain:  Vitals:   04/21/17 0605  TempSrc: Oral      Patients Stated Pain Goal: 5 (96/29/52 8413)  Complications: No apparent anesthesia complications

## 2017-04-22 ENCOUNTER — Encounter (HOSPITAL_BASED_OUTPATIENT_CLINIC_OR_DEPARTMENT_OTHER): Payer: Self-pay | Admitting: Urology

## 2017-04-22 NOTE — Progress Notes (Signed)
  Radiation Oncology         [phone] ________________________________  Name: Gregory Pham MRN: [id number]  Date: 04/22/2017  DOB: [birth year]       Prostate Seed Implant  IO:MBTDHR, Gwyndolyn Saxon, MD  No ref. provider found  DIAGNOSIS: 76 y.o. gentleman with Stage T1c adenocarcinoma of the prostate with Gleason Score of 4+3, and PSA of 6.96.    ICD-10-CM   1. Malignant neoplasm of prostate [facility]) [place] Discharge patient    PROCEDURE: Insertion of radioactive I-125 seeds into the prostate gland.  RADIATION DOSE: 145 Gy, definitive therapy.  TECHNIQUE: Gregory Pham was brought to the operating room with the urologist. He was placed in the dorsolithotomy position. He was catheterized and a rectal tube was inserted. The perineum was shaved, prepped and draped. The ultrasound probe was then introduced into the rectum to see the prostate gland.  TREATMENT DEVICE: A needle grid was attached to the ultrasound probe stand and anchor needles were placed.  3D PLANNING: The prostate was imaged in 3D using a sagittal sweep of the prostate probe. These images were transferred to the planning computer. There, the prostate, urethra and rectum were defined on each axial reconstructed image. Then, the software created an optimized 3D plan and a few seed positions were adjusted. The quality of the plan was reviewed using [facility] information for the target and the following two organs at risk:  Urethra and Rectum.  Then the accepted plan was uploaded to the seed Selectron afterloading unit.  PROSTATE VOLUME STUDY:  Using transrectal ultrasound the volume of the prostate was verified to be 30 cc.  SPECIAL TREATMENT PROCEDURE/SUPERVISION AND HANDLING: The Nucletron FIRST system was used to place the needles under sagittal guidance. A total of 23 needles were used to deposit 70 seeds in the prostate gland. The individual seed activity was 0.372 mCi.  SpaceOAR:  Yes  COMPLEX SIMULATION: At the end of the procedure,  an anterior radiograph of the pelvis was obtained to document seed positioning and count. Cystoscopy was performed to check the urethra and bladder.  MICRODOSIMETRY: At the end of the procedure, the patient was emitting 0.21 mR/hr at 1 meter. Accordingly, he was considered safe for hospital discharge.  PLAN: The patient will return to the radiation oncology clinic for post implant CT dosimetry in three weeks.   ________________________________  Sheral Apley Tammi Klippel, M.D.

## 2017-05-01 ENCOUNTER — Telehealth: Payer: Self-pay | Admitting: Internal Medicine

## 2017-05-01 NOTE — Telephone Encounter (Signed)
Patient reports that since his radiation seed implants for his prostate he is having difficulty with Constipation and having a BM.  He feels there is something in his rectum that is causing his difficulty with a BM.  He will come in and see Nicoletta Ba PA on 05/07/17 1:30

## 2017-05-07 ENCOUNTER — Ambulatory Visit: Payer: Medicare Other | Admitting: Physician Assistant

## 2017-05-12 ENCOUNTER — Ambulatory Visit (HOSPITAL_COMMUNITY)
Admission: RE | Admit: 2017-05-12 | Discharge: 2017-05-12 | Disposition: A | Discharge status: Home / Self Care | Payer: Medicare Other | Source: Ambulatory Visit | Attending: Urology | Admitting: Urology

## 2017-05-12 DIAGNOSIS — C61 Malignant neoplasm of prostate: Secondary | ICD-10-CM | POA: Diagnosis not present

## 2017-05-14 ENCOUNTER — Telehealth: Payer: Self-pay | Admitting: *Deleted

## 2017-05-14 NOTE — Telephone Encounter (Signed)
CALLED PATIENT TO REMIND OF POST SEED APPTS. FOR 05-15-17, SPOKE WITH PATIENT AND HE IS AWARE OF THESE APPTS.

## 2017-05-15 ENCOUNTER — Ambulatory Visit
Admission: RE | Admit: 2017-05-15 | Discharge: 2017-05-15 | Disposition: A | Discharge status: Home / Self Care | Payer: Medicare Other | Source: Ambulatory Visit | Attending: Radiation Oncology | Admitting: Radiation Oncology

## 2017-05-15 ENCOUNTER — Encounter: Payer: Self-pay | Admitting: Radiation Oncology

## 2017-05-15 VITALS — BP 130/69 | HR 77 | Temp 98.1°F | Resp 18 | Ht 69.0 in | Wt 172.2 lb

## 2017-05-15 DIAGNOSIS — Z7951 Long term (current) use of inhaled steroids: Secondary | ICD-10-CM | POA: Insufficient documentation

## 2017-05-15 DIAGNOSIS — Z7902 Long term (current) use of antithrombotics/antiplatelets: Secondary | ICD-10-CM | POA: Diagnosis not present

## 2017-05-15 DIAGNOSIS — Z885 Allergy status to narcotic agent status: Secondary | ICD-10-CM | POA: Insufficient documentation

## 2017-05-15 DIAGNOSIS — Z882 Allergy status to sulfonamides status: Secondary | ICD-10-CM | POA: Insufficient documentation

## 2017-05-15 DIAGNOSIS — R350 Frequency of micturition: Secondary | ICD-10-CM | POA: Insufficient documentation

## 2017-05-15 DIAGNOSIS — Z881 Allergy status to other antibiotic agents status: Secondary | ICD-10-CM | POA: Diagnosis not present

## 2017-05-15 DIAGNOSIS — Z923 Personal history of irradiation: Secondary | ICD-10-CM | POA: Insufficient documentation

## 2017-05-15 DIAGNOSIS — R3911 Hesitancy of micturition: Secondary | ICD-10-CM | POA: Insufficient documentation

## 2017-05-15 DIAGNOSIS — C61 Malignant neoplasm of prostate: Secondary | ICD-10-CM | POA: Diagnosis present

## 2017-05-15 DIAGNOSIS — Z79899 Other long term (current) drug therapy: Secondary | ICD-10-CM | POA: Diagnosis not present

## 2017-05-15 DIAGNOSIS — K649 Unspecified hemorrhoids: Secondary | ICD-10-CM | POA: Diagnosis not present

## 2017-05-15 DIAGNOSIS — Z888 Allergy status to other drugs, medicaments and biological substances status: Secondary | ICD-10-CM | POA: Insufficient documentation

## 2017-05-15 NOTE — Progress Notes (Signed)
Radiation Oncology         [phone] ________________________________  Name: Gregory Pham MRN: [id number]  Date: 05/15/2017  DOB: [birth year]  Follow-Up Visit Note  CC: Shirline Frees, MD  Festus Aloe, MD  Diagnosis:   76 y.o. gentleman with Stage T1c adenocarcinoma of the prostate with Gleason Score of 4+3, and PSA of 6.96.    ICD-10-CM   1. Malignant neoplasm of prostate ([place]) C61     Interval Since Last Radiation: 3.5  weeks  04/21/17:  Insertion of radioactive I-125 seeds into the prostate gland; 145 Gy, definitive therapy.  Narrative:  The patient returns today for routine follow-up.  He is complaining of increased urinary frequency and urinary hesitation symptoms. He denies gross hematuria, dysuria, fever or chills.  He does report weak flow of stream, intermittency, hesitancy, increased frequency, urgency and nocturia x4/night.  He feels that he is emptying his bladder adequately on voiding.  He filled out a questionnaire regarding urinary function today providing and overall IPSS score of 18 characterizing his symptoms as moderate.  His pre-implant score was 2. He also reports persistent loose stool and aggravated hemorrhoids.  He has tried using Pepto Bismol with mild relief and Citrucel. He has not tried any OTC medications for his hemorrhoids.  He denies hematochezia, abdominal pain, nausea or vomiting.  ALLERGIES:  is allergic to azithromycin; cephalexin; clarithromycin; clindamycin/lincomycin; darvocet [propoxyphene n-acetaminophen]; flexeril [cyclobenzaprine hcl]; flomax [tamsulosin hcl]; hydrocodone; hyoscyamine; naprosyn [naproxen]; oxycodone; skelaxin; tramadol; tussionex pennkinetic er [hydrocod polst-cpm polst er]; levofloxacin; and sulfa antibiotics.  Meds: Current Outpatient Prescriptions  Medication Sig Dispense Refill  . atenolol (TENORMIN) 50 MG tablet Take 50 mg by mouth every morning.     . Cholecalciferol (VITAMIN D) 2000 units CAPS Take 1 capsule by  mouth daily.    . clopidogrel (PLAVIX) 75 MG tablet Take 1 tablet (75 mg total) by mouth every morning.    . fish oil-omega-3 fatty acids 1000 MG capsule Take 1 g by mouth 3 (three) times daily after meals.    . fluticasone (FLONASE) 50 MCG/ACT nasal spray Place 1 spray into both nostrils at bedtime.     . Fluticasone-Salmeterol (ADVAIR DISKUS) 100-50 MCG/DOSE AEPB Inhale 1 puff into the lungs 2 (two) times daily.     . hydrOXYzine (ATARAX/VISTARIL) 10 MG tablet Take 20 mg by mouth at bedtime. for insomnia.    . Magnesium 500 MG CAPS Take 500 mg by mouth every morning.    . Multiple Vitamin (MULTIVITAMIN) tablet Take 1 tablet by mouth every morning.     . RABEprazole (ACIPHEX) 20 MG tablet TAKE 1 TABLET BY MOUTH TWICE DAILY 60 tablet 0  . ranitidine (ZANTAC) 300 MG tablet Take 300 mg by mouth at bedtime.     . simvastatin (ZOCOR) 20 MG tablet Take 20 mg by mouth every morning.     . diclofenac sodium (VOLTAREN) 1 % GEL Apply 1 application topically 2 (two) times daily as needed. Reported on 03/04/2016    . Melatonin 3 MG CAPS Take 3 mg by mouth at bedtime.    . nitroGLYCERIN (NITROSTAT) 0.4 MG SL tablet Place 0.4 mg under the tongue every 5 (five) minutes as needed. Reported on 03/04/2016     No current facility-administered medications for this encounter.     Physical Findings:  height is 5\' 9"  (1.753 m) and weight is 172 lb 3.2 oz (78.1 kg). His oral temperature is 98.1 F (36.7 C). His blood pressure is 130/69 and his pulse  is 77. His respiration is 18 and oxygen saturation is 97%.   In general this is a well appearing caucasian male in no acute distress. He's alert and oriented x4 and appropriate throughout the examination. Cardiopulmonary assessment is negative for acute distress and he exhibits normal effort.   Lab Findings: Lab Results  Component Value Date   WBC 5.7 04/10/2017   HGB 15.9 04/10/2017   HCT 45.4 04/10/2017   MCV 86.3 04/10/2017   PLT 138 (L) 04/10/2017     Radiographic Findings:  Patient underwent CT imaging in our clinic for post implant dosimetry. Dr. Tammi Klippel personally reviewed the images and the CT appears to demonstrate an adequate distribution of radioactive seeds throughout the prostate gland. There are no seeds in or near the rectum. We suspect the final radiation plan and dosimetry will show appropriate coverage of the prostate gland.   Impression: The patient is recovering from the effects of radiation. His urinary symptoms should gradually improve over the next 4-6 months. We talked about this today. He is encouraged by his improvement already and is otherwise pleased with his outcome. We have recommended that he try Metamucil mixed with 4 oz of water or Immodium AD prn to help with loose stools. He is also advised to perform warm sitz baths and use Tucks pads and/or Anusol prn painful hemorrhoids.  Plan: Today, we spent time talking to the patient about his prostate seed implant and resolving urinary symptoms. We also talked about long-term follow-up for prostate cancer following seed implant. He understands that ongoing PSA determinations and digital rectal exams will help perform surveillance to rule out disease recurrence. He has a follow up appointment with Dr. Junious Silk in 07/2017.  He understands what to expect with his PSA measures. He was also educated today about some of the long-term effects from radiation including a small risk for rectal bleeding and possibly erectile dysfunction. We talked about some of the general management approaches to these potential complications. However, I did encourage the patient to contact our office or return at any point if he has questions or concerns related to his previous radiation and prostate cancer.   Nicholos Johns, PA-C    Tyler Pita, MD  [facility] Direct Dial: [phone]  Fax: [phone] [email or web address]  Skype  LinkedIn    Page Me

## 2017-05-15 NOTE — Progress Notes (Signed)
[place] in for post seed implant follow up appointment 04-21-17.  Weight and vital signs are stable.  Reports having watery stools ever since the seed implant.  Reports nocturia x 4,denies dysuria, no other urinary issues., no hematuria.  Appetite is good.  Having fatigue most days. Ipss score 18. Wt Readings from Last 3 Encounters:  05/15/17 172 lb 3.2 oz (78.1 kg)  04/21/17 174 lb 8 oz (79.2 kg)  04/17/17 172 lb 8 oz (78.2 kg)  BP 130/69   Pulse 77   Temp 98.1 F (36.7 C) (Oral)   Resp 18   Ht 5\' 9"  (1.753 m)   Wt 172 lb 3.2 oz (78.1 kg)   SpO2 97%   BMI 25.43 kg/m

## 2017-05-15 NOTE — Addendum Note (Signed)
Encounter addended by: Heywood Footman, RN on: 05/15/2017  2:53 PM<BR>    Actions taken: Charge Capture section accepted

## 2017-05-15 NOTE — Progress Notes (Signed)
  Radiation Oncology         [phone] ________________________________  Name: Gregory Pham MRN: [id number]  Date: 05/15/2017  DOB: [birth year]  COMPLEX SIMULATION NOTE  NARRATIVE:  The patient was brought to the [facility] today following prostate seed implantation approximately one month ago.  Identity was confirmed.  All relevant records and images related to the planned course of therapy were reviewed.  Then, the patient was set-up supine.  CT images were obtained.  The CT images were loaded into the planning software.  Then the prostate and rectum were contoured.  Treatment planning then occurred.  The implanted iodine 125 seeds were identified by the physics staff for projection of radiation distribution  I have requested : 3D Simulation  I have requested a DVH of the following structures: Prostate and rectum.    ________________________________  Sheral Apley Tammi Klippel, M.D.

## 2017-05-26 ENCOUNTER — Encounter: Payer: Self-pay | Admitting: Radiation Oncology

## 2017-05-26 DIAGNOSIS — C61 Malignant neoplasm of prostate: Secondary | ICD-10-CM | POA: Diagnosis not present

## 2017-06-14 NOTE — Progress Notes (Signed)
  Radiation Oncology         [phone] ________________________________  Name: [name] MRN: [id number]  Date: 05/26/2017  DOB: 18-Jan-1941  3D Planning Note   Prostate Brachytherapy Post-Implant Dosimetry  Diagnosis: 76 y.o. gentleman with Stage T1c adenocarcinoma of the prostate with Gleason Score of 4+3, and PSA of 6.96  Narrative: On a previous date, Gregory Pham returned following prostate seed implantation for post implant planning. He underwent CT scan complex simulation to delineate the three-dimensional structures of the pelvis and demonstrate the radiation distribution.  Since that time, the seed localization, and complex isodose planning with dose volume histograms have now been completed.  Results:   Prostate Coverage - The dose of radiation delivered to the 90% or more of the prostate gland (D90) was 105.24% of the prescription dose. This exceeds our goal of greater than 90%. Rectal Sparing - The volume of rectal tissue receiving the prescription dose or higher was 0.0 cc. This falls under our thresholds tolerance of 1.0 cc.  Impression: The prostate seed implant appears to show adequate target coverage and appropriate rectal sparing.  Plan:  The patient will continue to follow with urology for ongoing PSA determinations. I would anticipate a high likelihood for local tumor control with minimal risk for rectal morbidity.  ________________________________  Sheral Apley Tammi Klippel, M.D.

## 2017-06-30 ENCOUNTER — Ambulatory Visit: Payer: Medicare Other | Admitting: Internal Medicine

## 2018-05-17 ENCOUNTER — Telehealth: Payer: Self-pay | Admitting: Cardiology

## 2018-05-17 NOTE — Telephone Encounter (Signed)
Spoke with the patient about his cramping. He has had a cramping sensation below his left breast that wraps around under his arm. This cramp has been occurring for about 1 year and only occurs during laughing or performing a shaking motion. The patient states it is painful when the cramp comes on but the subsides with rest. Spoke with Dr. Radford Pax, she advised the patient speak with PCP about this concern. The patient agreed and had no further questions.

## 2018-05-17 NOTE — Telephone Encounter (Signed)
New message   Pt c/o of Chest Pain: STAT if CP now or developed within 24 hours  1. Are you having CP right now? No   2. Are you experiencing any other symptoms (ex. SOB, nausea, vomiting, sweating)?patient states that he has a cramping when he laughs in his left chest   3. How long have you been experiencing CP? Over a year  4. Is your CP continuous or coming and going? Coming and going  5. Have you taken Nitroglycerin?no  ?

## 2018-07-23 ENCOUNTER — Ambulatory Visit (INDEPENDENT_AMBULATORY_CARE_PROVIDER_SITE_OTHER): Payer: Medicare Other | Admitting: Cardiology

## 2018-07-23 ENCOUNTER — Encounter: Payer: Self-pay | Admitting: Cardiology

## 2018-07-23 VITALS — BP 128/64 | HR 61 | Ht 69.0 in | Wt 177.0 lb

## 2018-07-23 DIAGNOSIS — I77810 Thoracic aortic ectasia: Secondary | ICD-10-CM | POA: Diagnosis not present

## 2018-07-23 DIAGNOSIS — I471 Supraventricular tachycardia: Secondary | ICD-10-CM | POA: Diagnosis not present

## 2018-07-23 DIAGNOSIS — I10 Essential (primary) hypertension: Secondary | ICD-10-CM | POA: Diagnosis not present

## 2018-07-23 NOTE — Patient Instructions (Signed)

## 2018-07-23 NOTE — Progress Notes (Signed)
Cardiology Office Note:    Date:  07/23/2018   ID:  Gregory Pham, DOB January 25, 1941, MRN [id number]  PCP:  Shirline Frees, MD  Cardiologist:  No primary care provider on file.    Referring MD: Shirline Frees, MD   Chief Complaint  Patient presents with  . Follow-up    SVT, dilated aorta, HTN    History of Present Illness:    Gregory Pham is a 77 y.o. male with a hx of PSVT, mild AI, mildly dilated aortic root,  HTN, HLD. He has history of remote PSVT, well-managed with atenolol. Last echo 07/2015 showed EF 55-60%, grade 2 DD, trivial AI, aortic root not dilated. Prior nuc 07/2015 was unremarkable with EF 74%, low risk study. He is here today for followup and is doing well.  He denies any chest pain or pressure, SOB, DOE, PND, orthopnea, LE edema, dizziness, palpitations or syncope. He is compliant with his meds and is tolerating meds with no SE.    Past Medical History:  Diagnosis Date  . Allergic rhinitis   . Anxiety   . Asthma   . Borderline diabetes    watches diet  . COPD (chronic obstructive pulmonary disease) ([place])   . CVD (cardiovascular disease)   . Disc herniation    L4-L5, S/P epiduiral steroids  . Diverticulosis of colon   . GERD (gastroesophageal reflux disease)   . History of adenomatous polyp of colon    tubular adenoma's 2006;  12-12-2011  . History of Bell's palsy    approx. 2003  left side-- resolved -- no issue since  . 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 History of esophageal stricture    post dilation 2006  . History of exercise stress test 12/13/2013   ETT-- normal , Duke treadmill score +9 (no ischemia or ecg changes)  . History of hiatal hernia   . History of transient ischemic attack (TIA) 2013   no residual  . Hyperlipidemia   . Hypertension   . Leg cramps   . Nocturia   . Osteoarthritis   . Prostate cancer [facility]) uroloigst-   dr eskridge/  oncologist-  dr Tammi Klippel   dx 12-12-2016 (bx)  Stage T1c,  Gleason 4+3,  PSA 6.96, vol 19-47cc  . PSVT (paroxysmal supraventricular tachycardia) [facility])    cardiologist-  dr Tressia Miners turner--- 04-10-2017  pt denied any s&s  . Renal cyst, right    urologist-  dr Junious Silk  . Wears glasses     Past Surgical History:  Procedure Laterality Date  . APPENDECTOMY  2012  . CARDIOVASCULAR STRESS TEST  08-10-2015  dr Tressia Miners turner   Low risk nuclear study w/ no ischemia/  normal LV function and wall motion , nuclear stress ef 74%  . CHOLECYSTECTOMY  08/12/2012   Procedure: LAPAROSCOPIC CHOLECYSTECTOMY WITH INTRAOPERATIVE CHOLANGIOGRAM;  Surgeon: Ralene Ok, MD;  Location: [place];  Service: General;  Laterality: N/A;  . COLONOSCOPY  last one 12-12-2211  dr Carlean Purl  . CYSTOSCOPY N/A 04/21/2017   Procedure: CYSTOSCOPY FLEXIBLE;  Surgeon: Festus Aloe, MD;  Location: [facility];  Service: Urology;  Laterality: N/A;  NO SEEDS FOUND IN BLADDER  . ESOPHAGOGASTRODUODENOSCOPY (EGD) WITH ESOPHAGEAL DILATION  11/2004  . INGUINAL HERNIA REPAIR Bilateral 2012  . NASAL SINUS SURGERY  1980's  . RADIOACTIVE SEED IMPLANT N/A 04/21/2017   Procedure: RADIOACTIVE SEED IMPLANT/BRACHYTHERAPY IMPLANT;  Surgeon: Junious Silk,  Rodman Key, MD;  Location: [facility];  Service: Urology;  Laterality: N/A;   70  SEEDS IMPLANTED  . SPACE OAR INSTILLATION N/A 04/21/2017   Procedure: SPACE OAR INSTILLATION;  Surgeon: Festus Aloe, MD;  Location: [facility];  Service: Urology;  Laterality: N/A;  . TRANSTHORACIC ECHOCARDIOGRAM  08-10-2015  dr Tressia Miners turner   grade 2 diastolic dysfunction, ef 02-54%/  trivial AR, MR, PR and TR/ mild dilated IVC    Current Medications: Current Meds  Medication Sig  . atenolol (TENORMIN) 50 MG tablet Take 50 mg by mouth every morning.   . Cholecalciferol (VITAMIN D) 2000 units CAPS Take 1 capsule by mouth daily.  . clopidogrel (PLAVIX) 75 MG  tablet Take 1 tablet (75 mg total) by mouth every morning.  . fish oil-omega-3 fatty acids 1000 MG capsule Take 1 g by mouth 3 (three) times daily after meals.  . fluticasone (FLONASE) 50 MCG/ACT nasal spray Place 1 spray into both nostrils at bedtime.   . Fluticasone-Salmeterol (ADVAIR DISKUS) 100-50 MCG/DOSE AEPB Inhale 1 puff into the lungs 2 (two) times daily.   . hydrOXYzine (ATARAX/VISTARIL) 10 MG tablet Take 20 mg by mouth at bedtime. for insomnia.  . Magnesium 500 MG CAPS Take 500 mg by mouth every morning.  . Melatonin 3 MG CAPS Take 3 mg by mouth at bedtime.  . Multiple Vitamin (MULTIVITAMIN) tablet Take 1 tablet by mouth every morning.   . nitroGLYCERIN (NITROSTAT) 0.4 MG SL tablet Place 0.4 mg under the tongue every 5 (five) minutes as needed. Reported on 03/04/2016  . RABEprazole (ACIPHEX) 20 MG tablet TAKE 1 TABLET BY MOUTH TWICE DAILY  . ranitidine (ZANTAC) 300 MG tablet Take 300 mg by mouth at bedtime.   . simvastatin (ZOCOR) 20 MG tablet Take 20 mg by mouth every morning.      Allergies:   Azithromycin; Cephalexin; Clarithromycin; Clindamycin/lincomycin; Darvocet [propoxyphene n-acetaminophen]; Flexeril [cyclobenzaprine hcl]; Flomax [tamsulosin hcl]; Hydrocodone; Hyoscyamine; Naprosyn [naproxen]; Oxycodone; Skelaxin; Tramadol; Tussionex pennkinetic er [employer] er]; Levofloxacin; and Sulfa antibiotics   Social History   Socioeconomic History  . Marital status: Married    Spouse name: Not on file  . Number of children: 1  . Years of education: Not on file  . Highest education level: Not on file  Occupational History  . Occupation: Retired     Fish farm manager: RETIRED  Social Needs  . Financial resource strain: Not on file  . Food insecurity:    Worry: Not on file    Inability: Not on file  . Transportation needs:    Medical: Not on file    Non-medical: Not on file  Tobacco Use  . Smoking status: Never Smoker  . Smokeless tobacco: Never Used  Substance and  Sexual Activity  . Alcohol use: No  . Drug use: No  . Sexual activity: Never  Lifestyle  . Physical activity:    Days per week: Not on file    Minutes per session: Not on file  . Stress: Not on file  Relationships  . Social connections:    Talks on phone: Not on file    Gets together: Not on file    Attends religious service: Not on file    Active member of club or organization: Not on file    Attends meetings of clubs or organizations: Not on file    Relationship status: Not on file  Other Topics Concern  . Not on file  Social History Narrative  Daily caffeine      Family History: The patient's family history includes Aneurysm in his mother; Breast cancer in his mother and sister; CVA in his mother; Colon cancer in his sister; Diabetes in his brother and father; Heart attack in his father; Heart disease in his father and mother; Pancreatic cancer in his sister; Parkinsonism in his father; Prostate cancer in his brother; Stroke in his mother.  ROS:   Please see the history of present illness.    ROS  All other systems reviewed and negative.   EKGs/Labs/Other Studies Reviewed:    The following studies were reviewed today: none  EKG:  EKG is  ordered today.  The ekg ordered today demonstrates NSR with LAD and no ST changes  Recent Labs: No results found for requested labs within last 8760 hours.   Recent Lipid Panel    Component Value Date/Time   CHOL  10/21/2010 0600    153        ATP III CLASSIFICATION:  <200     mg/dL   Desirable  200-239  mg/dL   Borderline High  >=240    mg/dL   High          TRIG 288 (H) 10/21/2010 0600   HDL 31 (L) 10/21/2010 0600   CHOLHDL 4.9 10/21/2010 0600   VLDL 58 (H) 10/21/2010 0600   LDLCALC  10/21/2010 0600    64        Total Cholesterol/HDL:CHD Risk Coronary Heart Disease Risk Table                     Men   Women  1/2 Average Risk   3.4   3.3  Average Risk       5.0   4.4  2 X Average Risk   9.6   7.1  3 X Average Risk   23.4   11.0        Use the calculated Patient Ratio above and the CHD Risk Table to determine the patient's CHD Risk.        ATP III CLASSIFICATION (LDL):  <100     mg/dL   Optimal  100-129  mg/dL   Near or Above                    Optimal  130-159  mg/dL   Borderline  160-189  mg/dL   High  >190     mg/dL   Very High    Physical Exam:    VS:  BP 128/64   Pulse 61   Ht 5\' 9"  (1.753 m)   Wt 177 lb (80.3 kg)   SpO2 96%   BMI 26.14 kg/m     Wt Readings from Last 3 Encounters:  07/23/18 177 lb (80.3 kg)  05/15/17 172 lb 3.2 oz (78.1 kg)  04/21/17 174 lb 8 oz (79.2 kg)     GEN:  Well nourished, well developed in no acute distress HEENT: Normal NECK: No JVD; No carotid bruits LYMPHATICS: No lymphadenopathy CARDIAC: RRR, no murmurs, rubs, gallops RESPIRATORY:  Clear to auscultation without rales, wheezing or rhonchi  ABDOMEN: Soft, non-tender, non-distended MUSCULOSKELETAL:  No edema; No deformity  SKIN: Warm and dry NEUROLOGIC:  Alert and oriented x 3 PSYCHIATRIC:  Normal affect   ASSESSMENT:    1. PSVT (paroxysmal supraventricular tachycardia) ([place])   2. Dilated aortic root ([place])   3. Essential hypertension    PLAN:    In order of problems  listed above:  1.  PSVT - He denies any palpitations.  He will continue on Atenolol 50mg  daily.  2.  Dilated aortic root - echo 2016 showed normal aortic root and a sending aortic dimensions at 35 mm.  3.  HTN - BP is controlled on exam today.  He will continue on Atenolol 50mg  daily.    Medication Adjustments/Labs and Tests Ordered: Current medicines are reviewed at length with the patient today.  Concerns regarding medicines are outlined above.  No orders of the defined types were placed in this encounter.  No orders of the defined types were placed in this encounter.   Signed, Fransico Him, MD  07/23/2018 8:02 AM    [place]

## 2019-07-29 ENCOUNTER — Telehealth: Payer: Self-pay | Admitting: *Deleted

## 2019-07-29 NOTE — Telephone Encounter (Signed)

## 2019-08-01 ENCOUNTER — Telehealth: Payer: Self-pay | Admitting: Cardiology

## 2019-08-01 ENCOUNTER — Encounter: Payer: Self-pay | Admitting: Cardiology

## 2019-08-01 ENCOUNTER — Other Ambulatory Visit: Payer: Self-pay

## 2019-08-01 ENCOUNTER — Telehealth (INDEPENDENT_AMBULATORY_CARE_PROVIDER_SITE_OTHER): Payer: Medicare Other | Admitting: Cardiology

## 2019-08-01 VITALS — BP 121/66 | HR 61 | Ht 69.0 in | Wt 177.2 lb

## 2019-08-01 DIAGNOSIS — Z8673 Personal history of transient ischemic attack (TIA), and cerebral infarction without residual deficits: Secondary | ICD-10-CM

## 2019-08-01 DIAGNOSIS — I10 Essential (primary) hypertension: Secondary | ICD-10-CM | POA: Diagnosis not present

## 2019-08-01 DIAGNOSIS — I471 Supraventricular tachycardia: Secondary | ICD-10-CM | POA: Diagnosis not present

## 2019-08-01 DIAGNOSIS — R072 Precordial pain: Secondary | ICD-10-CM | POA: Diagnosis not present

## 2019-08-01 DIAGNOSIS — R079 Chest pain, unspecified: Secondary | ICD-10-CM

## 2019-08-01 NOTE — Telephone Encounter (Signed)
  Patient is calling to let Dr Radford Pax know that he does have left leg cramps that comes and goes. He forgot to tell her that at his appointment today.

## 2019-08-01 NOTE — Telephone Encounter (Signed)
Patient reports that he has been having cramps in his left leg when he gets out of bed for the past few months. He states that it only happens once every couple weeks . He also states that his left hand has been getting colder than his right. He also reports a pulsating feeling on the right side of his head. He otherwise feels fine but was concerned about theses symptoms and wanted Dr. Radford Pax to know, as he forgot to mention them at his virtual appointment with her today.

## 2019-08-01 NOTE — Patient Instructions (Addendum)
Medication Instructions:  Your physician recommends that you continue on your current medications as directed. Please refer to the Current Medication list given to you today.  *If you need a refill on your cardiac medications before your next appointment, please call your pharmacy.  Testing/Procedures: Your physician has requested that you have a lexiscan myoview.   Follow-Up: At [facility], you and your health needs are our priority.  As part of our continuing mission to provide you with exceptional heart care, we have created designated Provider Care Teams.  These Care Teams include your primary Cardiologist (physician) and Advanced Practice Providers (APPs -  Physician Assistants and Nurse Practitioners) who all work together to provide you with the care you need, when you need it.  Your next appointment:   1 year(s)  The format for your next appointment:   Either In Person or Virtual  Provider:   You may see No primary care provider on file. or one of the following Advanced Practice Providers on your designated Care Team:    Melina Copa, PA-C  Ermalinda Barrios, PA-C

## 2019-08-01 NOTE — Progress Notes (Signed)
Virtual Visit via Telephone Note   This visit type was conducted due to national recommendations for restrictions regarding the COVID-19 Pandemic (e.g. social distancing) in an effort to limit this patient's exposure and mitigate transmission in our community.  Due to his co-morbid illnesses, this patient is at least at moderate risk for complications without adequate follow up.  This format is felt to be most appropriate for this patient at this time.  All issues noted in this document were discussed and addressed.  A limited physical exam was performed with this format.  Please refer to the patient's chart for his consent to telehealth for [facility].   Evaluation Performed:  Follow-up visit  This visit type was conducted due to national recommendations for restrictions regarding the COVID-19 Pandemic (e.g. social distancing).  This format is felt to be most appropriate for this patient at this time.  All issues noted in this document were discussed and addressed.  No physical exam was performed (except for noted visual exam findings with Video Visits).  Please refer to the patient's chart (MyChart message for video visits and phone note for telephone visits) for the patient's consent to telehealth for [facility].  Date:  08/01/2019   ID:  Gregory Pham, DOB Apr 26, 1941, MRN [id number]  Patient Location:  Home  Provider location:   [place]  PCP:  Gregory Frees, MD  Cardiologist:  Fransico Him, MD Electrophysiologist:  None   Chief Complaint:  SVT,  HTN  History of Present Illness:    DEDRICK Pham is a 78 y.o. male who presents via audio/video conferencing for a telehealth visit today.    Gregory Pham is an 78 y.o. male with a hx of PSVT, mild AI,  HTN, HLD. He has history of remote PSVT, well-managed with atenolol. Last echo 07/2015 showed EF 55-60%, grade 2 DD, trivial AI, aortic root not dilated. Prior nuc 07/2015 was unremarkable with EF 74%, low risk study.   He  is here today for followup and is doing well.  He has been having some problems recently with discomfort in his chest like a cramp.  It is very brief in duration for about 2-3 minutes and will be a few days in between.  There is no radiation of the pain.  There are no associated sx of SOB, N or diaphoresis.  He denies any  SOB, DOE, PND, orthopnea, LE edema, dizziness, palpitations or syncope. He is compliant with his meds and is tolerating meds with no SE.    The patient does not have symptoms concerning for COVID-19 infection (fever, chills, cough, or new shortness of breath).    Prior CV studies:   The following studies were reviewed today:  none  Past Medical History:  Diagnosis Date  . Allergic rhinitis   . Anxiety   . Asthma   . Borderline diabetes    watches diet  . COPD (chronic obstructive pulmonary disease) ([place] Pham)   . CVD (cardiovascular disease)   . Disc herniation    L4-L5, S/P epiduiral steroids  . Diverticulosis of colon   . GERD (gastroesophageal reflux disease)   . History of adenomatous polyp of colon    tubular adenoma's 2006;  12-12-2011  . History of Bell's palsy    approx. 2003  left side-- resolved -- no issue since  . 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 History of esophageal  stricture    post dilation 2006  . History of exercise stress test 12/13/2013   ETT-- normal , Duke treadmill score +9 (no ischemia or ecg changes)  . History of hiatal hernia   . History of transient ischemic attack (TIA) 2013   no residual  . Hyperlipidemia   . Hypertension   . Leg cramps   . Nocturia   . Osteoarthritis   . Prostate cancer [facility]) uroloigst-  Gregory Pham/  oncologist-  Gregory Pham   dx 12-12-2016 (bx)  Stage T1c,  Gleason 4+3,  PSA 6.96, vol 19-47cc  . PSVT (paroxysmal supraventricular tachycardia) [facility])    cardiologist-  Gregory Gregory Pham Gregory Pham--- 04-10-2017   pt denied any s&s  . Renal cyst, right    urologist-  Gregory Gregory Pham  . Wears glasses    Past Surgical History:  Procedure Laterality Date  . APPENDECTOMY  2012  . CARDIOVASCULAR STRESS TEST  08-10-2015  Gregory Gregory Pham Damond Borchers   Low risk nuclear study w/ no ischemia/  normal LV function and wall motion , nuclear stress ef 74%  . CHOLECYSTECTOMY  08/12/2012   Procedure: LAPAROSCOPIC CHOLECYSTECTOMY WITH INTRAOPERATIVE CHOLANGIOGRAM;  Surgeon: Gregory Ok, MD;  Location: [place];  Service: General;  Laterality: N/A;  . COLONOSCOPY  last one 12-12-2211  Gregory Gregory Pham  . CYSTOSCOPY N/A 04/21/2017   Procedure: CYSTOSCOPY FLEXIBLE;  Surgeon: Gregory Aloe, MD;  Location[facility];  Service: Urology;  Laterality: N/A;  NO SEEDS FOUND IN BLADDER  . ESOPHAGOGASTRODUODENOSCOPY (EGD) WITH ESOPHAGEAL DILATION  11/2004  . INGUINAL HERNIA REPAIR Bilateral 2012  . NASAL SINUS SURGERY  1980's  . RADIOACTIVE SEED IMPLANT N/A 04/21/2017   Procedure: RADIOACTIVE SEED IMPLANT/BRACHYTHERAPY IMPLANT;  Surgeon: Gregory Aloe, MD;  Location: [facility];  Service: Urology;  Laterality: N/A;   70  SEEDS IMPLANTED  . SPACE OAR INSTILLATION N/A 04/21/2017   Procedure: SPACE OAR INSTILLATION;  Surgeon: Gregory Aloe, MD;  Location: [facility];  Service: Urology;  Laterality: N/A;  . TRANSTHORACIC ECHOCARDIOGRAM  08-10-2015  Gregory Gregory Pham Tahani Potier   grade 2 diastolic dysfunction, ef [id number]  trivial AR, MR, PR and TR/ mild dilated IVC     Current Meds  Medication Sig  . atenolol (TENORMIN) 50 MG tablet Take 50 mg by mouth every morning.   . Cholecalciferol (VITAMIN D) 2000 units CAPS Take 1 capsule by mouth daily.  . clopidogrel (PLAVIX) 75 MG tablet Take 1 tablet (75 mg total) by mouth every morning.  . fish oil-omega-3 fatty acids 1000 MG capsule Take 1 g by mouth 3 (three) times daily after meals.  . fluticasone (FLONASE) 50 MCG/ACT nasal spray Place 1 spray into both  nostrils at bedtime.   . Fluticasone-Salmeterol (ADVAIR DISKUS) 100-50 MCG/DOSE AEPB Inhale 1 puff into the lungs 2 (two) times daily.   . hydrOXYzine (ATARAX/VISTARIL) 10 MG tablet Take 20 mg by mouth at bedtime. for insomnia.  . Magnesium 500 MG CAPS Take 500 mg by mouth every morning.  . Melatonin 3 MG CAPS Take 3 mg by mouth at bedtime.  . Multiple Vitamin (MULTIVITAMIN) tablet Take 1 tablet by mouth every morning.   . nitroGLYCERIN (NITROSTAT) 0.4 MG SL tablet Place 0.4 mg under the tongue every 5 (five) minutes as needed. Reported on 03/04/2016  . RABEprazole (ACIPHEX) 20 MG tablet TAKE 1 TABLET BY MOUTH TWICE DAILY  . simvastatin (ZOCOR) 20 MG tablet Take 20 mg by mouth every morning.      Allergies:  Azithromycin, Cephalexin, Clarithromycin, Clindamycin/lincomycin, Darvocet [propoxyphene n-acetaminophen], Flexeril [cyclobenzaprine hcl], Flomax [tamsulosin hcl], Hydrocodone, Hyoscyamine, Naprosyn [naproxen], Oxycodone, Skelaxin, Tramadol, Tussionex pennkinetic er [hydrocod polst-cpm polst er], Levofloxacin, and Sulfa antibiotics   Social History   Tobacco Use  . Smoking status: Never Smoker  . Smokeless tobacco: Never Used  Substance Use Topics  . Alcohol use: No  . Drug use: No     Family Hx: The patient's family history includes Aneurysm in his mother; Breast cancer in his mother and sister; CVA in his mother; Colon cancer in his sister; Diabetes in his brother and father; Heart attack in his father; Heart disease in his father and mother; Pancreatic cancer in his sister; Parkinsonism in his father; Prostate cancer in his brother; Stroke in his mother.  ROS:   Please see the history of present illness.     All other systems reviewed and are negative.   Labs/Other Tests and Data Reviewed:    Recent Labs: No results found for requested labs within last 8760 hours.   Recent Lipid Panel Lab Results  Component Value Date/Time   CHOL  10/21/2010 06:00 AM    153        ATP  III CLASSIFICATION:  <200     mg/dL   Desirable  200-239  mg/dL   Borderline High  >=240    mg/dL   High          TRIG 288 (H) 10/21/2010 06:00 AM   HDL 31 (L) 10/21/2010 06:00 AM   CHOLHDL 4.9 10/21/2010 06:00 AM   LDLCALC  10/21/2010 06:00 AM    64        Total Cholesterol/HDL:CHD Risk Coronary Heart Disease Risk Table                     Men   Women  1/2 Average Risk   3.4   3.3  Average Risk       5.0   4.4  2 X Average Risk   9.6   7.1  3 X Average Risk  23.4   11.0        Use the calculated Patient Ratio above and the CHD Risk Table to determine the patient's CHD Risk.        ATP III CLASSIFICATION (LDL):  <100     mg/dL   Optimal  100-129  mg/dL   Near or Above                    Optimal  130-159  mg/dL   Borderline  160-189  mg/dL   High  >190     mg/dL   Very High    Wt Readings from Last 3 Encounters:  08/01/19 177 lb 3.2 oz (80.4 kg)  07/23/18 177 lb (80.3 kg)  05/15/17 172 lb 3.2 oz (78.1 kg)     Objective:    Vital Signs:  BP 121/66   Pulse 61   Ht 5\' 9"  (1.753 m)   Wt 177 lb 3.2 oz (80.4 kg)   BMI 26.17 kg/m     ASSESSMENT & PLAN:    1.  PSVT -he denies any recurrent palpitations -continue BB  2.  HTN -BP controlled -continue Atenolol 50mg  daily  3,  Chest pain -this is atypical and is nonexertional.  There are no associated sx either. -I will get a Lexiscan myoview to rule out ischemia  COVID-19 Education: The signs and symptoms of COVID-19 were discussed with the patient  and how to seek care for testing (follow up with PCP or arrange E-visit).  The importance of social distancing was discussed today.  Patient Risk:   After full review of this patient's clinical status, I feel that they are at least moderate risk at this time.  Time:   Today, I have spent 20 minutes directly with the patient on telemedicine discussing medical problems including HTN, CP and SVT.  We also reviewed the symptoms of COVID 19 and the ways to protect against  contracting the virus with telehealth technology.  I spent an additional 5 minutes reviewing patient's chart including labs and 2D echo.  Medication Adjustments/Labs and Tests Ordered: Current medicines are reviewed at length with the patient today.  Concerns regarding medicines are outlined above.  Tests Ordered: No orders of the defined types were placed in this encounter.  Medication Changes: No orders of the defined types were placed in this encounter.   Disposition:  Follow up in 1 year(s)  Signed, Fransico Him, MD  08/01/2019 1:08 PM    [facility]

## 2019-08-02 ENCOUNTER — Telehealth (HOSPITAL_COMMUNITY): Payer: Self-pay

## 2019-08-02 NOTE — Telephone Encounter (Signed)
Please get upper extremity dopplers to rule out subclavian stenosis

## 2019-08-02 NOTE — Telephone Encounter (Signed)
Instructions left on the answering machine. Asked to call back with any questions. S.Linnaea Ahn EMTP

## 2019-08-02 NOTE — Telephone Encounter (Signed)
Orders have been placed for UE dopplers and patient has been notified. He verbalized understanding.

## 2019-08-04 ENCOUNTER — Ambulatory Visit (HOSPITAL_COMMUNITY): Payer: Medicare Other | Attending: Cardiovascular Disease

## 2019-08-04 ENCOUNTER — Other Ambulatory Visit: Payer: Self-pay

## 2019-08-04 DIAGNOSIS — R072 Precordial pain: Secondary | ICD-10-CM | POA: Diagnosis not present

## 2019-08-04 LAB — MYOCARDIAL PERFUSION IMAGING
LV dias vol: 66 mL (ref 62–150)
LV sys vol: 16 mL
Peak HR: 97 {beats}/min
Rest HR: 67 {beats}/min
SDS: 0
SRS: 0
SSS: 0
TID: 0.94

## 2019-08-04 MED ORDER — TECHNETIUM TC 99M TETROFOSMIN IV KIT
11.0000 | PACK | Freq: Once | INTRAVENOUS | Status: AC | PRN
  Administered 2019-08-04: 11 via INTRAVENOUS
  Filled 2019-08-04: qty 11

## 2019-08-04 MED ORDER — REGADENOSON 0.4 MG/5ML IV SOLN
0.4000 mg | Freq: Once | INTRAVENOUS | Status: AC
  Administered 2019-08-04: 0.4 mg via INTRAVENOUS

## 2019-08-04 MED ORDER — TECHNETIUM TC 99M TETROFOSMIN IV KIT
31.6000 | PACK | Freq: Once | INTRAVENOUS | Status: AC | PRN
  Administered 2019-08-04: 31.6 via INTRAVENOUS
  Filled 2019-08-04: qty 32

## 2019-08-05 ENCOUNTER — Other Ambulatory Visit: Payer: Self-pay | Admitting: Cardiology

## 2019-08-05 DIAGNOSIS — R202 Paresthesia of skin: Secondary | ICD-10-CM

## 2019-08-05 DIAGNOSIS — Z8673 Personal history of transient ischemic attack (TIA), and cerebral infarction without residual deficits: Secondary | ICD-10-CM

## 2019-08-08 ENCOUNTER — Other Ambulatory Visit: Payer: Self-pay

## 2019-08-08 ENCOUNTER — Ambulatory Visit (HOSPITAL_COMMUNITY)
Admission: RE | Admit: 2019-08-08 | Discharge: 2019-08-08 | Disposition: A | Discharge status: Home / Self Care | Payer: Medicare Other | Source: Ambulatory Visit | Attending: Cardiology | Admitting: Cardiology

## 2019-08-08 DIAGNOSIS — Z8673 Personal history of transient ischemic attack (TIA), and cerebral infarction without residual deficits: Secondary | ICD-10-CM

## 2019-08-08 DIAGNOSIS — R202 Paresthesia of skin: Secondary | ICD-10-CM | POA: Diagnosis not present

## 2021-12-30 ENCOUNTER — Encounter: Payer: Self-pay | Admitting: Physician Assistant

## 2022-01-23 ENCOUNTER — Ambulatory Visit: Payer: Medicare Other | Admitting: Physician Assistant

## 2022-01-29 ENCOUNTER — Other Ambulatory Visit (HOSPITAL_COMMUNITY): Payer: Self-pay | Admitting: Urology

## 2022-01-29 DIAGNOSIS — R9721 Rising PSA following treatment for malignant neoplasm of prostate: Secondary | ICD-10-CM

## 2022-02-04 ENCOUNTER — Encounter (HOSPITAL_COMMUNITY)
Admission: RE | Admit: 2022-02-04 | Discharge: 2022-02-04 | Disposition: A | Discharge status: Home / Self Care | Payer: Medicare Other | Source: Ambulatory Visit | Attending: Urology | Admitting: Urology

## 2022-02-04 DIAGNOSIS — R9721 Rising PSA following treatment for malignant neoplasm of prostate: Secondary | ICD-10-CM | POA: Diagnosis present

## 2022-02-04 MED ORDER — PIFLIFOLASTAT F 18 (PYLARIFY) INJECTION
9.0000 | Freq: Once | INTRAVENOUS | Status: AC
  Administered 2022-02-04: 8.49 via INTRAVENOUS

## 2022-02-26 ENCOUNTER — Ambulatory Visit: Payer: Medicare Other | Admitting: Internal Medicine

## 2022-04-18 ENCOUNTER — Other Ambulatory Visit (HOSPITAL_COMMUNITY): Payer: Self-pay

## 2023-04-15 IMAGING — CT NM PET TUM IMG SKULL BASE T - THIGH
7 series · 25 of 25 positions shown · non-contrast
Comparison: CT abdomen 03/27/2020

CLINICAL DATA: Elevated PSA after prostate cancer treatment. PSA
equal

EXAM:
NUCLEAR MEDICINE PET SKULL BASE TO THIGH
TECHNIQUE: 8.5 mCi F18 Piflufolastat (Pylarify) was injected intravenously.
Full-ring PET imaging was performed from the skull base to thigh
after the radiotracer. CT data was obtained and used for attenuation
correction and anatomic localization.

[Series 3: pet sk_thigh ac · axial · 5.0mm · 4.07mm/px · z∈[-1198,-138]mm · 6 of 266 slices shown]
[im 1/266]
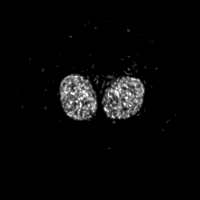
[im 54/266]
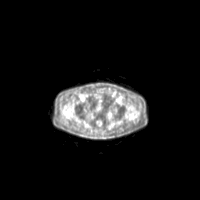
[im 107/266]
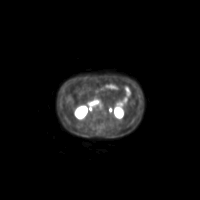
[im 160/266]
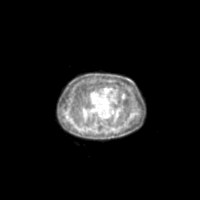
[im 213/266]
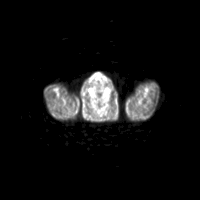
[im 266/266]
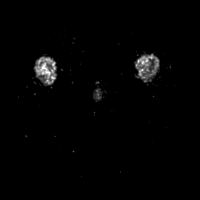

[Series 4: ct sk_thigh 5.0 br38 · axial · 5.0mm · 0.98mm/px · z∈[-1198,-138]mm · 5 of 266 slices shown]
[im 1/266]
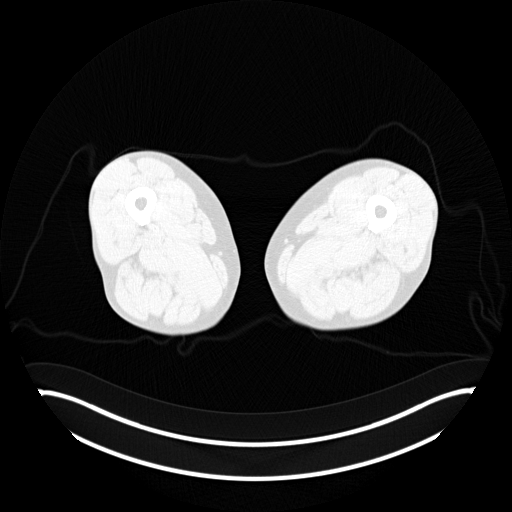
[im 67/266]
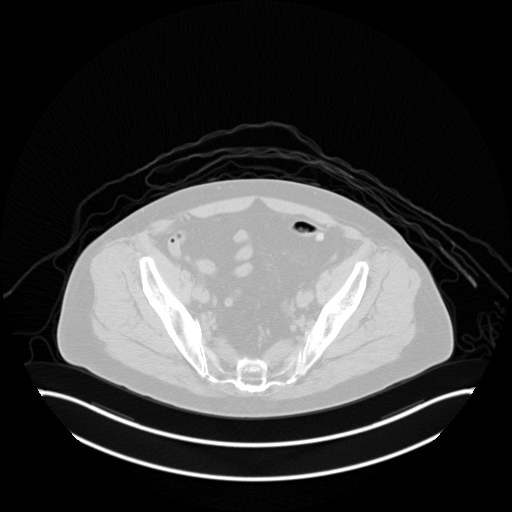
[im 133/266]
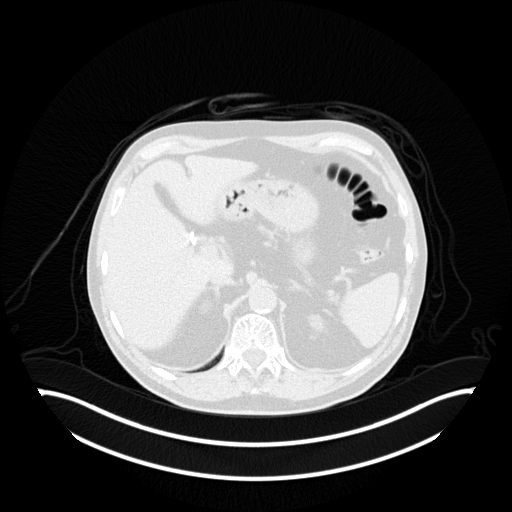
[im 199/266]
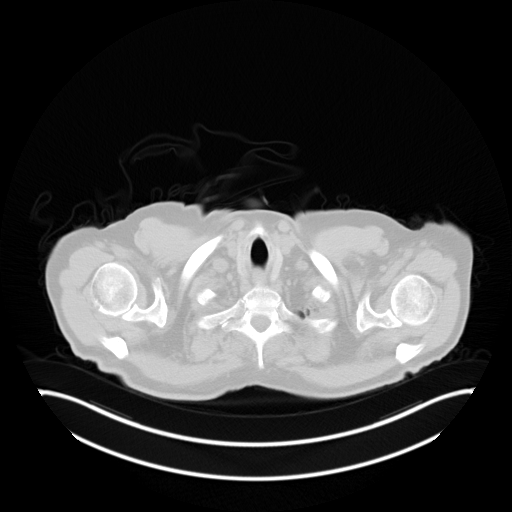
[im 266/266]
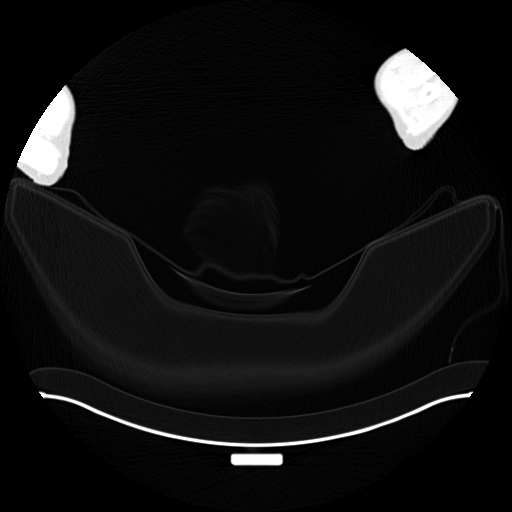

[Series 5: pet sk_thigh nac · axial · 5.0mm · 4.07mm/px · z∈[-1198,-138]mm · 5 of 266 slices shown]
[im 1/266]
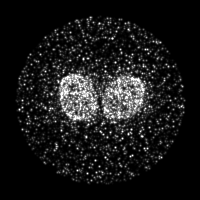
[im 67/266]
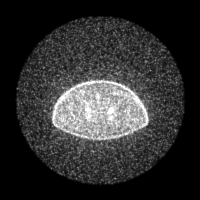
[im 133/266]
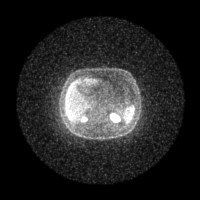
[im 199/266]
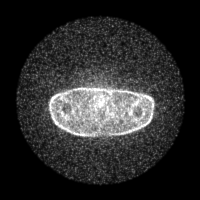
[im 266/266]
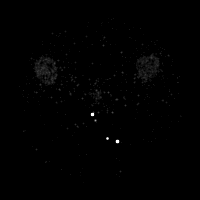

[Series 7: ct 5.0 bl57 lung_bone · axial · 5.0mm · 0.74mm/px · z∈[-707,-387]mm · 2 of 81 slices shown]
[im 1/81]
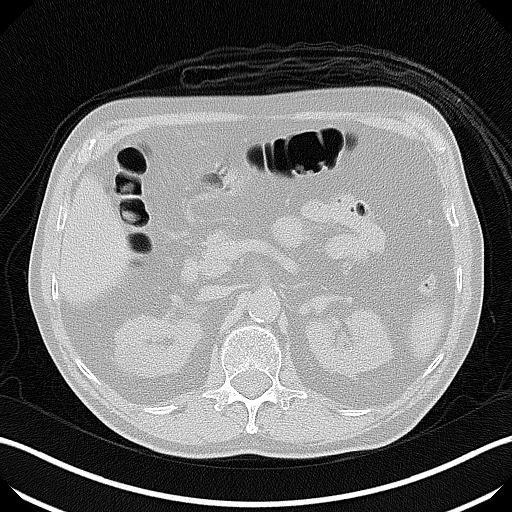
[im 81/81]
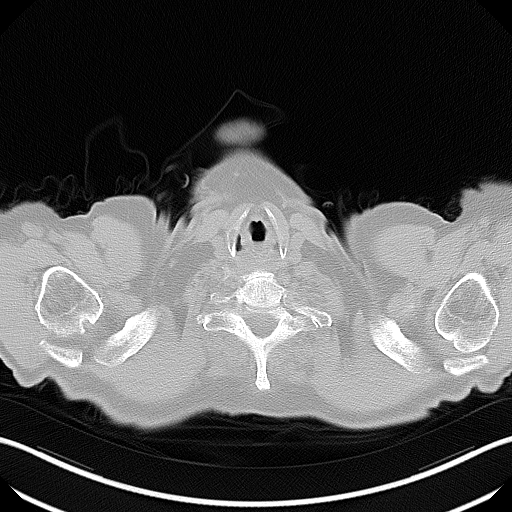

[Series 604: fused tra · 5 of 255 slices shown]
[im 1/255]
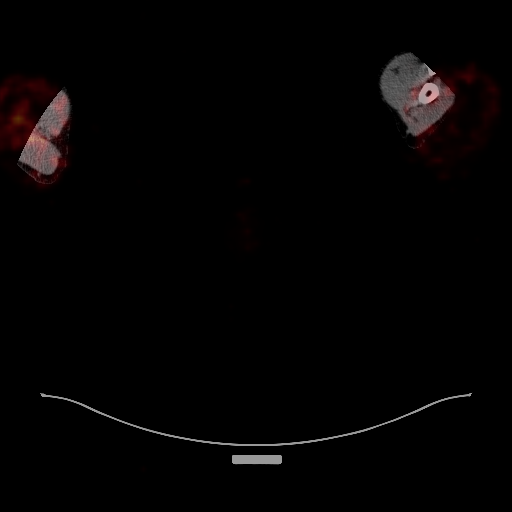
[im 64/255]
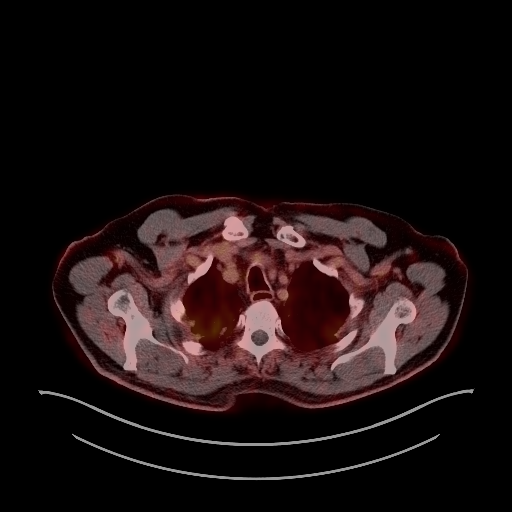
[im 128/255]
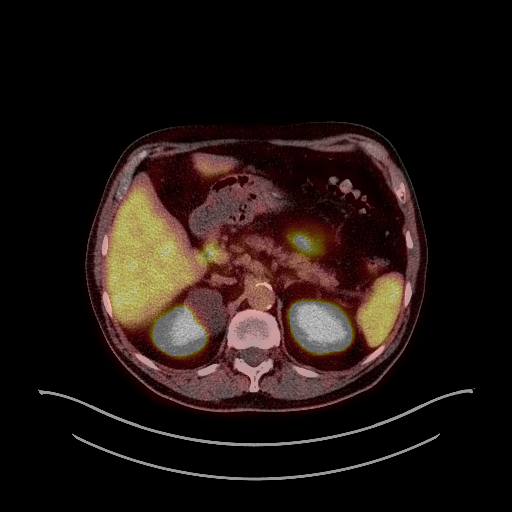
[im 191/255]
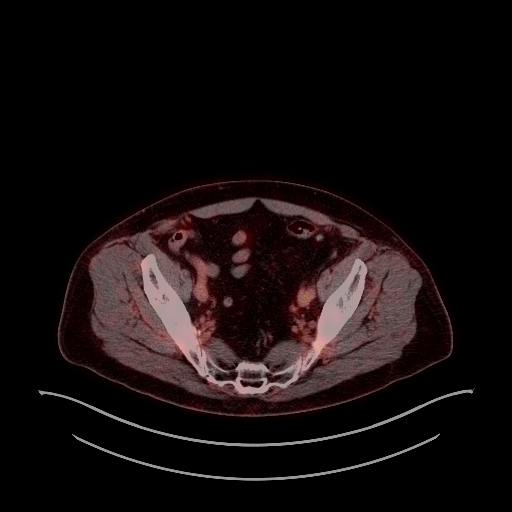
[im 255/255]
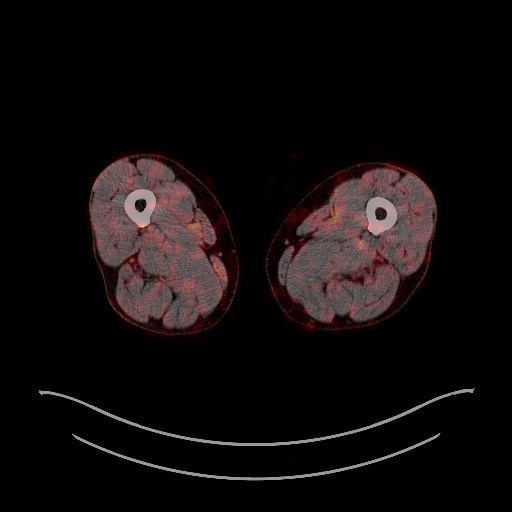

[Series 605: fused cor · 1 of 69 slices shown]
[im 1/69]
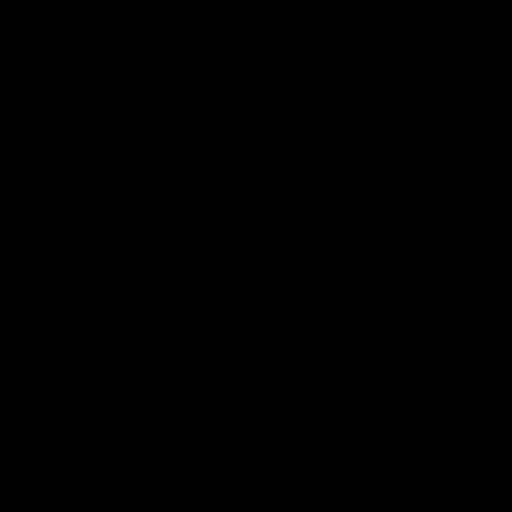

[Series 606: mip pet · coronal · 2.20mm/px · 1 of 32 slices shown]
[im 1/32]
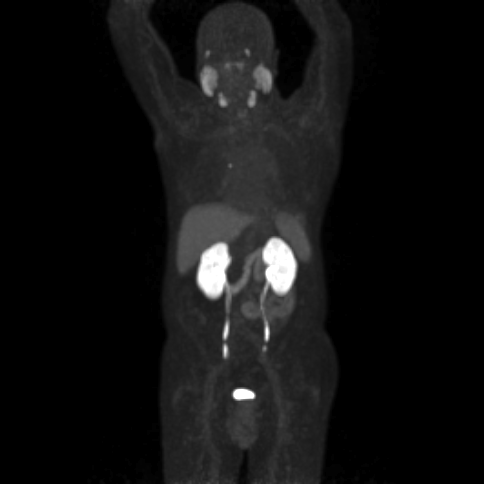

[25 of 25 positions shown; findings below may reference images not displayed]

FINDINGS: NECK

No radiotracer activity in neck lymph nodes.

Incidental CT finding: None

CHEST

There is single focus of intense radiotracer activity localizing to
a small nodular density in the subpleural space along the RIGHT
upper lobe medial mediastinum measuring 8 mm (image 92/series 4)
with SUV max equal

No additional abnormal activity in the mediastinum.

There are several pleural base nodules in the LEFT lower lobe with
mild radiotracer activity. For example 8 mm nodule in the medial
LEFT lower lobe (image 115/4) with SUV max equal 2.5. Adjacent
nodule measuring 6 mm with SUV max equal 2.5. Finally nodule on
image 120 LEFT lower lobe with SUV max equal

On CT of the abdomen pelvis 03/27/2020, there was several nodules in
LEFT lower lobe at similar location.

Incidental CT finding: None

ABDOMEN/PELVIS

Prostate: Brachytherapy seeds in the prostate gland. No focal
activity.

Lymph nodes: No abnormal radiotracer accumulation within pelvic or
abdominal nodes.

Liver: No evidence of liver metastasis

Incidental CT finding: None

SKELETON

No focal  activity to suggest skeletal metastasis.
IMPRESSION: 1. Intense radiotracer activity associated with a RIGHT upper lobe
subpleural nodule along the mediastinal border. Unusual location
prostate cancer metastasis; however, findings are suspicious given
the intensity of activity
2. Mild radiotracer activity associated with several subpleural
nodules in the most inferior LEFT lower lobe. There were nodules on
comparison exam from 03/27/2020. These findings are more typical of
benign nonspecific radiotracer activity; however, these nodules may
be accessible for biopsy where as the right paramediastinal nodule
described in impression #1 would be difficult to access.
3. No evidence of metastatic adenopathy in the abdomen pelvis. No
skeletal metastasis.
